# Patient Record
Sex: Female | Born: 1985 | Race: White | Hispanic: No | Smoking: Never smoker
Health system: Southern US, Community
[De-identification: ages and names within clinical notes are randomized; demographics above are authoritative.]

## PROBLEM LIST (undated history)

## (undated) DIAGNOSIS — J189 Pneumonia, unspecified organism: Secondary | ICD-10-CM

## (undated) DIAGNOSIS — K219 Gastro-esophageal reflux disease without esophagitis: Secondary | ICD-10-CM

## (undated) DIAGNOSIS — F909 Attention-deficit hyperactivity disorder, unspecified type: Secondary | ICD-10-CM

## (undated) HISTORY — PX: BREAST ENHANCEMENT SURGERY: SHX7

---

## 2009-06-05 HISTORY — PX: BREAST ENHANCEMENT SURGERY: SHX7

## 2009-06-28 ENCOUNTER — Emergency Department (HOSPITAL_COMMUNITY): Admission: EM | Admit: 2009-06-28 | Discharge: 2009-06-28 | Payer: Self-pay | Admitting: Emergency Medicine

## 2013-01-05 ENCOUNTER — Emergency Department (HOSPITAL_COMMUNITY)
Admission: EM | Admit: 2013-01-05 | Discharge: 2013-01-05 | Disposition: A | Discharge status: Home / Self Care | Attending: Emergency Medicine | Admitting: Emergency Medicine

## 2013-01-05 ENCOUNTER — Emergency Department (HOSPITAL_COMMUNITY)

## 2013-01-05 ENCOUNTER — Encounter (HOSPITAL_COMMUNITY): Payer: Self-pay | Admitting: *Deleted

## 2013-01-05 DIAGNOSIS — R079 Chest pain, unspecified: Secondary | ICD-10-CM

## 2013-01-05 DIAGNOSIS — Y939 Activity, unspecified: Secondary | ICD-10-CM | POA: Insufficient documentation

## 2013-01-05 DIAGNOSIS — Y929 Unspecified place or not applicable: Secondary | ICD-10-CM | POA: Insufficient documentation

## 2013-01-05 DIAGNOSIS — R0602 Shortness of breath: Secondary | ICD-10-CM | POA: Insufficient documentation

## 2013-01-05 DIAGNOSIS — S298XXA Other specified injuries of thorax, initial encounter: Secondary | ICD-10-CM | POA: Insufficient documentation

## 2013-01-05 DIAGNOSIS — W1809XA Striking against other object with subsequent fall, initial encounter: Secondary | ICD-10-CM | POA: Insufficient documentation

## 2013-01-05 DIAGNOSIS — W19XXXA Unspecified fall, initial encounter: Secondary | ICD-10-CM

## 2013-01-05 MED ORDER — HYDROCODONE-ACETAMINOPHEN 5-325 MG PO TABS
2.0000 | ORAL_TABLET | Freq: Once | ORAL | Status: AC
  Administered 2013-01-05: 2 via ORAL
  Filled 2013-01-05: qty 2

## 2013-01-05 MED ORDER — HYDROCODONE-ACETAMINOPHEN 5-325 MG PO TABS: 1.0000 | ORAL_TABLET | Freq: Four times a day (QID) | ORAL | Status: DC | PRN

## 2013-01-05 NOTE — ED Notes (Signed)
Pt states that she was hiking this am and fall and struck her chest on the ground; pt c/o of feeling short of breath; tenderness to palpate to midsternal area; pt states left is more tender than right; no obvious deformity.

## 2013-01-05 NOTE — ED Provider Notes (Signed)
CSN: [id number]     Arrival date & time 01/05/13  2155 History     First MD Initiated Contact with Patient 01/05/13 2218     Chief Complaint  Patient presents with  . Fall  . Chest Injury   (Consider location/radiation/quality/duration/timing/severity/associated sxs/prior Treatment) Patient is a 27 y.o. female presenting with fall. The history is provided by the patient.  Fall This is a new problem. The current episode started yesterday. Episode frequency: once. The problem has not changed since onset.Associated symptoms include chest pain. Pertinent negatives include no shortness of breath. Nothing aggravates the symptoms. Nothing relieves the symptoms. She has tried nothing for the symptoms.    History reviewed. No pertinent past medical history. Past Surgical History  Procedure Laterality Date  . Breast enhancement surgery     No family history on file. History  Substance Use Topics  . Smoking status: Never Smoker   . Smokeless tobacco: Not on file  . Alcohol Use: Yes     Comment: occ   OB History   Grav Para Term Preterm Abortions TAB SAB Ect Mult Living                 Review of Systems  Respiratory: Negative for cough and shortness of breath.   Cardiovascular: Positive for chest pain.  All other systems reviewed and are negative.    Allergies  Review of patient's allergies indicates no known allergies.  Home Medications  No current outpatient prescriptions on file. BP 105/73  Pulse 74  Temp(Src) 98.8 F (37.1 C) (Oral)  Resp 19  Ht 5\' 10"  (1.778 m)  Wt 140 lb (63.504 kg)  BMI 20.09 kg/m2  SpO2 100%  LMP 12/26/2012 Physical Exam  Nursing note and vitals reviewed. Constitutional: She is oriented to person, place, and time. She appears well-developed and well-nourished. No distress.  HENT:  Head: Normocephalic and atraumatic.  Eyes: EOM are normal. Pupils are equal, round, and reactive to light.  Neck: Normal range of motion. Neck supple.   Cardiovascular: Normal rate and regular rhythm.  Exam reveals no friction rub.   No murmur heard. Pulmonary/Chest: Effort normal and breath sounds normal. No respiratory distress. She has no wheezes. She has no rales. She exhibits tenderness (mild lower sternal tenderness, bilateral cartilaginous tenderness along medial costal margin bilaterally).  Abdominal: Soft. She exhibits no distension. There is no tenderness. There is no rebound.  Musculoskeletal: Normal range of motion. She exhibits no edema.  Neurological: She is alert and oriented to person, place, and time.  Skin: She is not diaphoretic.    ED Course   Procedures (including critical care time)  Labs Reviewed - No data to display Dg Chest 2 View  01/05/2013   *RADIOLOGY REPORT*  Clinical Data: Fall, chest injury  CHEST - 2 VIEW  Comparison:  None.  Findings:  The heart size and mediastinal contours are within normal limits.  Both lungs are clear.  The visualized skeletal structures are unremarkable. Apparent body piercings noted of the spinal soft tissues.  Breast implants evident.  IMPRESSION: No active cardiopulmonary disease.   Original Report Authenticated By: Judie Petit. Miles Costain, M.D.   1. Fall, initial encounter   2. Chest pain     Date: 01/05/2013  Rate: 71  Rhythm: normal sinus rhythm  QRS Axis: normal  Intervals: normal  ST/T Wave abnormalities: normal  Conduction Disutrbances:none  Narrative Interpretation:   Old EKG Reviewed: none available    MDM   7F fall yesterday, landed on chest.  Persistent sternal tenderness since. Mild SOB due to chest pain. AFVSS here. Mild lower sternal tenderness, more tender along costal margins medially biaterally. No bony crepitus. No pain with AP or lateral compression. CXR normal. Given pain meds. Discharged with small amount of pain medicine.   Dagmar Hait, MD 01/05/13 [phone]

## 2013-01-16 ENCOUNTER — Encounter (HOSPITAL_COMMUNITY): Payer: Self-pay | Admitting: Emergency Medicine

## 2013-01-16 ENCOUNTER — Emergency Department (HOSPITAL_COMMUNITY)
Admission: EM | Admit: 2013-01-16 | Discharge: 2013-01-16 | Discharge status: Against Medical Advice | Attending: Emergency Medicine | Admitting: Emergency Medicine

## 2013-01-16 DIAGNOSIS — Y9301 Activity, walking, marching and hiking: Secondary | ICD-10-CM | POA: Insufficient documentation

## 2013-01-16 DIAGNOSIS — R296 Repeated falls: Secondary | ICD-10-CM | POA: Insufficient documentation

## 2013-01-16 DIAGNOSIS — Y929 Unspecified place or not applicable: Secondary | ICD-10-CM | POA: Insufficient documentation

## 2013-01-16 DIAGNOSIS — S298XXA Other specified injuries of thorax, initial encounter: Secondary | ICD-10-CM | POA: Insufficient documentation

## 2013-01-16 DIAGNOSIS — R071 Chest pain on breathing: Secondary | ICD-10-CM | POA: Insufficient documentation

## 2013-01-16 NOTE — ED Notes (Signed)
Pt states she was seen here recently for fall while hiking, xray completed. Pt c/o continued pain with breathing. Pt did not follow up with PCP.

## 2013-01-23 ENCOUNTER — Encounter: Payer: Self-pay | Admitting: Certified Nurse Midwife

## 2013-06-27 ENCOUNTER — Ambulatory Visit (INDEPENDENT_AMBULATORY_CARE_PROVIDER_SITE_OTHER): Admitting: Internal Medicine

## 2013-06-27 DIAGNOSIS — Z23 Encounter for immunization: Secondary | ICD-10-CM

## 2013-06-27 MED ORDER — CIPROFLOXACIN HCL 500 MG PO TABS: 500.0000 mg | ORAL_TABLET | Freq: Two times a day (BID) | ORAL | Status: DC

## 2013-06-27 MED ORDER — DEXAMETHASONE 4 MG PO TABS: 4.0000 mg | ORAL_TABLET | Freq: Two times a day (BID) | ORAL | Status: DC

## 2013-06-27 MED ORDER — ACETAZOLAMIDE 125 MG PO TABS: 125.0000 mg | ORAL_TABLET | Freq: Three times a day (TID) | ORAL | Status: DC

## 2013-06-27 MED ORDER — ACETAZOLAMIDE 125 MG PO TABS: 125.0000 mg | ORAL_TABLET | Freq: Two times a day (BID) | ORAL | Status: DC

## 2013-06-27 NOTE — Progress Notes (Signed)
  Subjective:    Katherine Keith is a 28 y.o. female who presents to the Infectious Disease clinic for travel consultation. Planned departure date: February          Planned return date: 1 week Countries of travel: [place] Areas in [place]   Accommodations: camping Purpose of travel: sporting (climbing or diving) Prior travel out of [place]: yes Currently ill / Fever: no History of liver or kidney disease: no  Data Review:  no medical problems   Review of Systems n/a    Objective:    n/a    Assessment:    No contraindications to travel. none      Plan:    Issues discussed: altitude illness, future shots, insect-borne illnesses, malaria, rabies, safe food/water, traveler's diarrhea, website/handouts for more information, what to do if ill upon return, what to do if ill while there and Yellow Fever.Discussed altitude illness.  Counseled on Diamox and rescue dexamethasone.  Counseled on risks of rapid ascent that she is doing.  Immunizations recommended: Hepatitis A series and Typhoid (parenteral). Malaria prophylaxis: not indicatedwill not be outside of mountain area and [place] directly there from [place], no other travel planned Traveler's diarrhea prophylaxis: ciprofloxacin.

## 2014-05-05 ENCOUNTER — Ambulatory Visit (INDEPENDENT_AMBULATORY_CARE_PROVIDER_SITE_OTHER): Admitting: Emergency Medicine

## 2014-05-05 ENCOUNTER — Other Ambulatory Visit: Payer: Self-pay | Admitting: Emergency Medicine

## 2014-05-05 ENCOUNTER — Ambulatory Visit (INDEPENDENT_AMBULATORY_CARE_PROVIDER_SITE_OTHER)

## 2014-05-05 ENCOUNTER — Ambulatory Visit
Admission: RE | Admit: 2014-05-05 | Discharge: 2014-05-05 | Disposition: A | Discharge status: Home / Self Care | Source: Ambulatory Visit | Attending: Emergency Medicine | Admitting: Emergency Medicine

## 2014-05-05 ENCOUNTER — Telehealth: Payer: Self-pay

## 2014-05-05 VITALS — BP 100/58 | HR 84 | Temp 98.9°F | Resp 18

## 2014-05-05 DIAGNOSIS — F458 Other somatoform disorders: Secondary | ICD-10-CM

## 2014-05-05 DIAGNOSIS — R0602 Shortness of breath: Secondary | ICD-10-CM

## 2014-05-05 LAB — COMPREHENSIVE METABOLIC PANEL
ALK PHOS: 56 U/L (ref 39–117)
ALT: 11 U/L (ref 0–35)
AST: 22 U/L (ref 0–37)
Albumin: 4.6 g/dL (ref 3.5–5.2)
BILIRUBIN TOTAL: 0.6 mg/dL (ref 0.2–1.2)
BUN: 18 mg/dL (ref 6–23)
CHLORIDE: 94 meq/L — AB (ref 96–112)
CO2: 31 meq/L (ref 19–32)
Calcium: 10.4 mg/dL (ref 8.4–10.5)
Creat: 1.3 mg/dL — ABNORMAL HIGH (ref 0.50–1.10)
Glucose, Bld: 90 mg/dL (ref 70–99)
POTASSIUM: 3.6 meq/L (ref 3.5–5.3)
Sodium: 134 mEq/L — ABNORMAL LOW (ref 135–145)
TOTAL PROTEIN: 7.7 g/dL (ref 6.0–8.3)

## 2014-05-05 LAB — POCT CBC
Granulocyte percent: 71.9 %G (ref 37–80)
HEMATOCRIT: 42 % (ref 37.7–47.9)
HEMOGLOBIN: 13.7 g/dL (ref 12.2–16.2)
Lymph, poc: 1.7 (ref 0.6–3.4)
MCH: 27.6 pg (ref 27–31.2)
MCHC: 32.6 g/dL (ref 31.8–35.4)
MCV: 84.8 fL (ref 80–97)
MID (cbc): 0.2 (ref 0–0.9)
MPV: 7.4 fL (ref 0–99.8)
POC Granulocyte: 4.7 (ref 2–6.9)
POC LYMPH PERCENT: 25.1 %L (ref 10–50)
POC MID %: 3 %M (ref 0–12)
Platelet Count, POC: 323 10*3/uL (ref 142–424)
RBC: 4.95 M/uL (ref 4.04–5.48)
RDW, POC: 15 %
WBC: 6.6 10*3/uL (ref 4.6–10.2)

## 2014-05-05 MED ORDER — IOHEXOL 350 MG/ML SOLN
100.0000 mL | Freq: Once | INTRAVENOUS | Status: AC | PRN
  Administered 2014-05-05: 100 mL via INTRAVENOUS

## 2014-05-05 NOTE — Progress Notes (Signed)
[facility] [address], [place] [id number] [phone]  Date:  05/05/2014   Name:  Katherine BarbaraKristen Nicklin   DOB:  05/12/1986   MRN:  [id number]  PCP:  No primary care provider on file.    Chief Complaint: Shortness of Breath and Cough   History of Present Illness:  Katherine Keith is a 28 y.o. very pleasant female patient who presents with the following:  Patient has three week history of shortness of breath. Partner says she makes a lot of noise "like death rattles" at night. She has no history of asthma or reactive airway disease.  No cough or sputum production No hemoptysis. No chest pain No nausea or vomiting. No coryza. Denies unusual stress or anxiety.  Works as bar tender  Non smoker.   IUD Says feels as though she experiences a "wall" that limits her lung expansion. Can't get a satisfying breath Has tingling around mouth and in fingers.  No carpopedal spasm. No improvement with over the counter medications or other home remedies.  Denies other complaint or health concern today.   There are no active problems to display for this patient.   No past medical history on file.  Past Surgical History  Procedure Laterality Date  . Breast enhancement surgery      History  Substance Use Topics  . Smoking status: Never Smoker   . Smokeless tobacco: Not on file  . Alcohol Use: Yes     Comment: occ    No family history on file.  No Known Allergies  Medication list has been reviewed and updated.  Current Outpatient Prescriptions on File Prior to Visit  Medication Sig Dispense Refill  . acetaZOLAMIDE (DIAMOX) 125 MG tablet Take 1 tablet (125 mg total) by mouth 2 (two) times daily. 14 tablet 0  . ciprofloxacin (CIPRO) 500 MG tablet Take 1 tablet (500 mg total) by mouth 2 (two) times daily. Take 2-3 days until diarrhea resolves 8 tablet 0  . dexamethasone (DECADRON) 4 MG tablet Take 1 tablet (4 mg total) by mouth 2 (two) times daily with a meal.  8 tablet 0  . HYDROcodone-acetaminophen (NORCO/VICODIN) 5-325 MG per tablet Take 1 tablet by mouth every 6 (six) hours as needed for pain. 20 tablet 0   No current facility-administered medications on file prior to visit.    Review of Systems:  As per HPI, otherwise negative.    Physical Examination: Filed Vitals:   05/05/14 1130  BP: 100/58  Pulse: 84  Temp: 98.9 F (37.2 C)  Resp: 18   There were no vitals filed for this visit. There is no weight on file to calculate BMI. Ideal Body Weight:    GEN: WDWN, NAD, Non-toxic, A & O x 3 HEENT: Atraumatic, Normocephalic. Neck supple. No masses, No LAD. Ears and Nose: No external deformity. CV: RRR, No M/G/R. No JVD. No thrill. No extra heart sounds. PULM: CTA B, no wheezes, crackles, rhonchi. No retractions. No resp. distress. No accessory muscle use. ABD: S, NT, ND, +BS. No rebound. No HSM. EXTR: No c/c/e NEURO Normal gait.  PSYCH: Normally interactive. Conversant. Not depressed or anxious appearing.  Calm demeanor.    Assessment and Plan: Shortness of breath Hyperventilation syndrome Pulmonary Ativan  Signed,  Phillips OdorJeffery Yannely Kintzel, MD   [facility] reading (PRIMARY) by  Dr. Dareen PianoAnderson.  Negative chest  Results for orders placed or performed in visit on 05/05/14  POCT CBC  Result Value Ref Range   WBC 6.6 4.6 -  10.2 K/uL   Lymph, poc 1.7 0.6 - 3.4   POC LYMPH PERCENT 25.1 10 - 50 %L   MID (cbc) 0.2 0 - 0.9   POC MID % 3.0 0 - 12 %M   POC Granulocyte 4.7 2 - 6.9   Granulocyte percent 71.9 37 - 80 %G   RBC 4.95 4.04 - 5.48 M/uL   Hemoglobin 13.7 12.2 - 16.2 g/dL   HCT, POC 16.142.0 09.637.7 - 47.9 %   MCV 84.8 80 - 97 fL   MCH, POC 27.6 27 - 31.2 pg   MCHC 32.6 31.8 - 35.4 g/dL   RDW, POC 04.515.0 %   Platelet Count, POC 323 142 - 424 K/uL   MPV 7.4 0 - 99.8 fL   .

## 2014-05-05 NOTE — Telephone Encounter (Signed)
Please call pt and let her know that her CT was neg and that we will refer her to pulm.  Katherine Keith called earlier and LM. Just tried calling again and left another message.

## 2014-05-06 NOTE — Telephone Encounter (Signed)
LM on dedicated VM advising pt we are referring her to a pulmonologist. Rtn call if any further questions.

## 2014-05-19 ENCOUNTER — Encounter: Payer: Self-pay | Admitting: Internal Medicine

## 2014-05-19 ENCOUNTER — Ambulatory Visit (INDEPENDENT_AMBULATORY_CARE_PROVIDER_SITE_OTHER): Admitting: Internal Medicine

## 2014-05-19 VITALS — BP 90/60 | HR 80 | Ht 69.0 in | Wt 147.0 lb

## 2014-05-19 DIAGNOSIS — R0600 Dyspnea, unspecified: Secondary | ICD-10-CM

## 2014-05-19 DIAGNOSIS — R0789 Other chest pain: Secondary | ICD-10-CM

## 2014-05-19 MED ORDER — FAMOTIDINE 20 MG PO TABS: ORAL_TABLET | ORAL | Status: DC

## 2014-05-19 MED ORDER — PANTOPRAZOLE SODIUM 40 MG PO TBEC: 40.0000 mg | DELAYED_RELEASE_TABLET | Freq: Every day | ORAL | Status: DC

## 2014-05-19 NOTE — Patient Instructions (Signed)
Pantoprazole (protonix) 40 mg   Take 30-60 min before first meal of the day and Pepcid 20 mg one bedtime until return to office - this is the best way to tell whether stomach acid is contributing to your problem.    GERD (REFLUX)  is an extremely common cause of respiratory symptoms just like yours , many times with no obvious heartburn at all.    It can be treated with medication, but also with lifestyle changes including avoidance of late meals, excessive alcohol, smoking cessation, and avoid fatty foods, chocolate, peppermint, colas, red wine, and acidic juices such as orange juice.  NO MINT OR MENTHOL PRODUCTS SO NO COUGH DROPS  USE SUGARLESS CANDY INSTEAD (Jolley ranchers or Stover's or Life Savers) or even ice chips will also do - the key is to swallow to prevent all throat clearing. NO OIL BASED VITAMINS - use powdered substitutes.   Classic subdiaphragmatic pain pattern suggests ibs:  Stereotypical, migratory with a very limited distribution of pain locations, daytime, not exacerbated by ex or coughing, worse in sitting position, associated with generalized abd bloating, not present supine due to the dome effect of the diaphragm is  canceled in that position. Frequently these patients have had multiple negative GI workups and CT scans.  Treatment consists of avoiding foods that cause gas (especially beans and raw vegetables like spinach and salads and boiled)  and citrucel 1 heaping tsp twice daily with a large glass of water.  Pain should improve w/in 2 weeks and if not then consider further GI work up.     Please schedule a follow up office visit in 4 weeks, sooner if needed

## 2014-05-19 NOTE — Progress Notes (Signed)
Subjective:    Patient ID: Katherine BarbaraKristen Effertz, female    DOB: 1985/09/18, MRN: [id number]  HPI  28 yowf [place] smoker from [place] somewhat overweight growing up and not aerobic but started aerobics spring 2015 after losing about 80 and making progress with wts and then aerobics  x 30 min then abruptly 05/05/14 while in early stages of routine ex develped sense of sob> stopped ex then drove to [facility] and develped  fleeting L cp while standing waiting to see the doc > CTa neg so Referred by Dr Marice PotterJeff anderson  to pulmonary clinic 05/19/14     05/19/2014 1st [facility] office visit/ [facility]   Chief Complaint  Patient presents with  . Pulmonary Consult    Referred by Dr. Thornton PapasJeffrey Anderson. Pt c/o tightness and SOB for the past 2 wks. She states that her chest and back constantly feel tight and she feels as if she is unable to take in a good deep breath.  She occ has sharp pain under left breast.   whenever awake feels can't get deep breath if thinks about it but doesn't disturb sleep nor does she have any cp supine but continues to have fleeting pains in LUQ sitting and standing and has not tried to resume ex  Has new IUD x 2 m  No obvious other patterns in day to day or daytime variabilty or assoc chronic cough or c  chest tightness, subjective wheeze overt sinus or hb symptoms. No unusual exp hx or h/o childhood pna/ asthma or knowledge of premature birth.  Sleeping ok without nocturnal  or early am exacerbation  of respiratory  c/o's or need for noct saba. Also denies any obvious fluctuation of symptoms with weather or environmental changes or other aggravating or alleviating factors except as outlined above   Current Medications, Allergies, Complete Past Medical History, Past Surgical History, Family History, and Social History were reviewed in [facility] electronic medical record.            Review of Systems  Constitutional: Negative for fever, chills and unexpected weight  change.  HENT: Negative for congestion, dental problem, ear pain, nosebleeds, postnasal drip, rhinorrhea, sinus pressure, sneezing, sore throat, trouble swallowing and voice change.   Eyes: Negative for visual disturbance.  Respiratory: Positive for shortness of breath. Negative for cough and choking.   Cardiovascular: Positive for chest pain. Negative for leg swelling.  Gastrointestinal: Negative for vomiting, abdominal pain and diarrhea.  Genitourinary: Negative for difficulty urinating.  Musculoskeletal: Negative for arthralgias.  Skin: Negative for rash.  Neurological: Negative for tremors, syncope and headaches.  Hematological: Does not bruise/bleed easily.       Objective:   Physical Exam  Pleasant amb wf nad  Wt Readings from Last 3 Encounters:  05/19/14 147 lb (66.679 kg)  01/05/13 140 lb (63.504 kg)    Vital signs reviewed   HEENT: nl dentition, turbinates, and orophanx. Nl external ear canals without cough reflex   NECK :  without JVD/Nodes/TM/ nl carotid upstrokes bilaterally   LUNGS: no acc muscle use, clear to A and P bilaterally without cough on insp or exp maneuvers   CV:  RRR  no s3 or murmur or increase in P2, no edema   ABD:  soft and nontender with nl excursion in the supine position. No bruits or organomegaly, bowel sounds nl  MS:  warm without deformities, calf tenderness, cyanosis or clubbing  SKIN: warm and dry without lesions    NEURO:  alert,  approp, no deficits   05/05/14  Negative for pulmonary embolus. Negative chest CT.           Assessment & Plan:

## 2014-05-20 DIAGNOSIS — R0789 Other chest pain: Secondary | ICD-10-CM | POA: Insufficient documentation

## 2014-05-20 NOTE — Assessment & Plan Note (Signed)

## 2014-05-20 NOTE — Assessment & Plan Note (Signed)
Spirometry 05/19/14 only abn in effort dep portion   Symptoms are markedly disproportionate to objective findings and not clear this is a lung problem but pt does appear to have difficult airway management issues. DDX of  difficult airways management all start with A and  include Adherence, Ace Inhibitors, Acid Reflux, Active Sinus Disease, Alpha 1 Antitripsin deficiency, Anxiety masquerading as Airways dz,  ABPA,  allergy(esp in young), Aspiration (esp in elderly), Adverse effects of DPI,  Active smokers, plus two Bs  = Bronchiectasis and Beta blocker use..and one C= CHF  Adherence is always the initial "prime suspect" and is a multilayered concern that requires a "trust but verify" approach in every patient - starting with knowing how to use medications, especially inhalers, correctly, keeping up with refills and understanding the fundamental difference between maintenance and prns vs those medications only taken for a very short course and then stopped and not refilled.   ? Acid (or non-acid) GERD > always difficult to exclude as up to 75% of pts in some series report no assoc GI/ Heartburn symptoms> rec max (24h)  acid suppression and diet restrictions/ reviewed and instructions given in writing.   ? Anxiety > strongly supported by pfts and absence of symptoms sleeping, needs to resume exercise

## 2014-06-16 ENCOUNTER — Ambulatory Visit: Admitting: Internal Medicine

## 2014-06-22 ENCOUNTER — Ambulatory Visit (INDEPENDENT_AMBULATORY_CARE_PROVIDER_SITE_OTHER): Admitting: Internal Medicine

## 2014-06-22 ENCOUNTER — Encounter: Payer: Self-pay | Admitting: Internal Medicine

## 2014-06-22 VITALS — BP 90/60 | HR 70 | Ht 69.0 in | Wt 147.6 lb

## 2014-06-22 DIAGNOSIS — R0600 Dyspnea, unspecified: Secondary | ICD-10-CM

## 2014-06-22 DIAGNOSIS — R0789 Other chest pain: Secondary | ICD-10-CM

## 2014-06-22 NOTE — Assessment & Plan Note (Signed)
rx as IBS 05/20/2014 > 90% improvement 06/22/2014 > reinforced diet/ citrucel > f/u gi prn

## 2014-06-22 NOTE — Progress Notes (Signed)
Subjective:    Patient ID: Katherine Keith, female    DOB: 01-20-1986, MRN: [id number]    Brief patient profile:  28 yowf [place] smoker from [place] somewhat overweight growing up and not aerobic but started aerobics spring 2015 after losing about 80 and making progress with wts and then aerobics  x 30 min then abruptly 05/05/14 while in early stages of routine ex develped sense of sob> stopped ex then drove to [facility] and develped  fleeting L cp while standing waiting to see the doc > CTa neg so Referred by Dr Marice PotterJeff anderson  to pulmonary clinic 05/19/14 and rx as gerd/ ibs.     History of Present Illness  05/19/2014 1st [facility] office visit/ Dvante Hands   Chief Complaint  Patient presents with  . Pulmonary Consult    Referred by Dr. Thornton PapasJeffrey Anderson. Pt c/o tightness and SOB for the past 2 wks. She states that her chest and back constantly feel tight and she feels as if she is unable to take in a good deep breath.  She occ has sharp pain under left breast.   whenever awake feels can't get deep breath if thinks about it but doesn't disturb sleep nor does she have any cp supine but continues to have fleeting pains in LUQ sitting and standing and has not tried to resume ex Has new IUD x 2 m rec Pantoprazole (protonix) 40 mg   Take 30-60 min before first meal of the day and Pepcid 20 mg one bedtime until return to office   GERD diet  IBS diet/ citrucel    06/22/2014 f/u ov/Nickole Adamek re: cp/ sob  Chief Complaint  Patient presents with  . Follow-up    Pt statea that her chest tightness and SOB are much improved, but not completely resolved. She denies any new co's today.   still pain under L breast, always sitting / never lying down / happens only 2-3 times a day now  Pain now only lasts a sec or two and gone, no pattern except always sitting / no relation to eating or flatus/ eructation.  Sob is 90%  is gone  No obvious day to day or daytime variabilty or assoc  cough chest tightness,  subjective wheeze overt sinus or hb symptoms. No unusual exp hx or h/o childhood pna/ asthma or knowledge of premature birth.  Sleeping ok without nocturnal  or early am exacerbation  of respiratory  c/o's or need for noct saba. Also denies any obvious fluctuation of symptoms with weather or environmental changes or other aggravating or alleviating factors except as outlined above   Current Medications, Allergies, Complete Past Medical History, Past Surgical History, Family History, and Social History were reviewed in [facility] electronic medical record.  ROS  The following are not active complaints unless bolded sore throat, dysphagia, dental problems, itching, sneezing,  nasal congestion or excess/ purulent secretions, ear ache,   fever, chills, sweats, unintended wt loss, pleuritic or exertional cp, hemoptysis,  orthopnea pnd or leg swelling, presyncope, palpitations, heartburn, abdominal pain, anorexia, nausea, vomiting, diarrhea  or change in bowel or urinary habits, change in stools or urine, dysuria,hematuria,  rash, arthralgias, visual complaints, headache, numbness weakness or ataxia or problems with walking or coordination,  change in mood/affect or memory.                        Objective:   Physical Exam  Pleasant amb wf nad  06/22/2014  148  Wt Readings from Last 3 Encounters:  05/19/14 147 lb (66.679 kg)  01/05/13 140 lb (63.504 kg)    Vital signs reviewed   HEENT: nl dentition, turbinates, and orophanx. Nl external ear canals without cough reflex   NECK :  without JVD/Nodes/TM/ nl carotid upstrokes bilaterally   LUNGS: no acc muscle use, clear to A and P bilaterally without cough on insp or exp maneuvers   CV:  RRR  no s3 or murmur or increase in P2, no edema   ABD:  soft and nontender with nl excursion in the supine position. No bruits or organomegaly, bowel sounds nl  MS:  warm without deformities, calf tenderness, cyanosis or clubbing  SKIN:  warm and dry without lesions        05/05/14  Negative for pulmonary embolus. Negative chest CT.           Assessment & Plan:

## 2014-06-22 NOTE — Progress Notes (Deleted)
   Subjective:    Patient ID: Katherine Keith, female    DOB: 26-Nov-1985, MRN: [id number]  HPI  28 yowf [place] overweight growing up and not aerobic but started aerobics spring 2015 after losing about 80 and making progress with wts and then aerobics  x 30 min then abruptly 05/05/14 while in early stages of routine ex develped sense of sob> stopped ex then drove to [facility] and develped  fleeting L cp while standing waiting to see the doc > CTa neg so Referred by Dr Marice PotterJeff anderson  to pulmonary clinic 05/19/14     05/19/2014 1st [facility] office visit/ Khyre Germond   Chief Complaint  Patient presents with  . Pulmonary Consult    Referred by Dr. Thornton PapasJeffrey Anderson. Pt c/o tightness and SOB for the past 2 wks. She states that her chest and back constantly feel tight and she feels as if she is unable to take in a good deep breath.  She occ has sharp pain under left breast.   whenever awake feels can't get deep breath if thinks about it but doesn't disturb sleep nor does she have any cp supine but continues to have fleeting pains in LUQ sitting and standing and has not tried to resume ex Has new IUD x 2 m rec Pantoprazole (protonix) 40 mg   Take 30-60 min before first meal of the day and Pepcid 20 mg one bedtime until return to office   GERD diet  IBS diet/ citrucel    06/22/2014 f/u ov/Tyshawna Alarid re:  No chief complaint on file.                     Objective:   Physical Exam  Pleasant amb wf nad  06/22/2014    Wt Readings from Last 3 Encounters:  05/19/14 147 lb (66.679 kg)  01/05/13 140 lb (63.504 kg)    Vital signs reviewed   HEENT: nl dentition, turbinates, and orophanx. Nl external ear canals without cough reflex   NECK :  without JVD/Nodes/TM/ nl carotid upstrokes bilaterally   LUNGS: no acc muscle use, clear to A and P bilaterally without cough on insp or exp maneuvers   CV:  RRR  no s3 or murmur or increase in P2, no edema   ABD:  soft and  nontender with nl excursion in the supine position. No bruits or organomegaly, bowel sounds nl  MS:  warm without deformities, calf tenderness, cyanosis or clubbing  SKIN: warm and dry without lesions    NEURO:  alert, approp, no deficits   05/05/14  Negative for pulmonary embolus. Negative chest CT.           Assessment & Plan:

## 2014-06-22 NOTE — Assessment & Plan Note (Signed)
Spirometry 05/19/14 only abn in effort dep portion   Resolved on rx for gerd, try to simplify to bid h2

## 2014-06-22 NOTE — Progress Notes (Signed)
   Subjective:    Patient ID: Katherine Keith, female    DOB: Dec 18, 1985, MRN: [id number]  HPI  28 yowf [place] overweight [place] and not aerobic but started aerobics spring 2015 after losing about 80 and making progress with wts and then aerobics  x 30 min then abruptly 05/05/14 while in early stages of routine ex develped sense of sob> stopped ex then drove to [facility] and develped  fleeting L cp while standing waiting to see the doc > CTa neg so Referred by Dr Marice PotterJeff anderson  to pulmonary clinic 05/19/14     05/19/2014 1st [facility] office visit/ Kamyia Thomason   Chief Complaint  Patient presents with  . Pulmonary Consult    Referred by Dr. Thornton PapasJeffrey Anderson. Pt c/o tightness and SOB for the past 2 wks. She states that her chest and back constantly feel tight and she feels as if she is unable to take in a good deep breath.  She occ has sharp pain under left breast.   whenever awake feels can't get deep breath if thinks about it but doesn't disturb sleep nor does she have any cp supine but continues to have fleeting pains in LUQ sitting and standing and has not tried to resume ex Has new IUD x 2 m rec Pantoprazole (protonix) 40 mg   Take 30-60 min before first meal of the day and Pepcid 20 mg one bedtime until return to office   GERD diet  IBS diet/ citrucel    06/22/2014 f/u ov/[facility] re:  Chief Complaint  Patient presents with  . Follow-up    Pt statea that her chest tightness and SOB are much improved, but not completely resolved. She denies any new co's today.                       Objective:   Physical Exam  Pleasant amb wf nad  06/22/2014    Wt Readings from Last 3 Encounters:  05/19/14 147 lb (66.679 kg)  01/05/13 140 lb (63.504 kg)    Vital signs reviewed   HEENT: nl dentition, turbinates, and orophanx. Nl external ear canals without cough reflex   NECK :  without JVD/Nodes/TM/ nl carotid upstrokes bilaterally   LUNGS: no acc muscle  use, clear to A and P bilaterally without cough on insp or exp maneuvers   CV:  RRR  no s3 or murmur or increase in P2, no edema   ABD:  soft and nontender with nl excursion in the supine position. No bruits or organomegaly, bowel sounds nl  MS:  warm without deformities, calf tenderness, cyanosis or clubbing  SKIN: warm and dry without lesions    NEURO:  alert, approp, no deficits   05/05/14  Negative for pulmonary embolus. Negative chest CT.           Assessment & Plan:

## 2014-11-04 ENCOUNTER — Emergency Department (INDEPENDENT_AMBULATORY_CARE_PROVIDER_SITE_OTHER)
Admission: EM | Admit: 2014-11-04 | Discharge: 2014-11-04 | Disposition: A | Discharge status: Home / Self Care | Source: Home / Self Care | Attending: Family Medicine | Admitting: Family Medicine

## 2014-11-04 ENCOUNTER — Encounter (HOSPITAL_COMMUNITY): Payer: Self-pay | Admitting: Emergency Medicine

## 2014-11-04 DIAGNOSIS — L299 Pruritus, unspecified: Secondary | ICD-10-CM | POA: Diagnosis not present

## 2014-11-04 DIAGNOSIS — L237 Allergic contact dermatitis due to plants, except food: Secondary | ICD-10-CM | POA: Diagnosis not present

## 2014-11-04 MED ORDER — PREDNISONE 5 MG PO TABS: ORAL_TABLET | ORAL | Status: DC

## 2014-11-04 MED ORDER — HYDROXYZINE HCL 25 MG PO TABS: 25.0000 mg | ORAL_TABLET | Freq: Four times a day (QID) | ORAL | Status: DC | PRN

## 2014-11-04 MED ORDER — METHYLPREDNISOLONE ACETATE 80 MG/ML IJ SUSP
INTRAMUSCULAR | Status: AC
  Filled 2014-11-04: qty 1

## 2014-11-04 MED ORDER — METHYLPREDNISOLONE ACETATE 80 MG/ML IJ SUSP
80.0000 mg | Freq: Once | INTRAMUSCULAR | Status: AC
  Administered 2014-11-04: 80 mg via INTRAMUSCULAR

## 2014-11-04 NOTE — ED Notes (Signed)
C/o poison ivy for 10 days  States her legs are swelling with blisters on them  Used home remedies and otc creams as tx

## 2014-11-04 NOTE — Discharge Instructions (Signed)
[facility] Poison oak is a rash caused by touching the leaves of the poison oak plant. You may have a rash with redness and itching. Sometimes, blisters appear and break open. Your eyes may get puffy (swollen). Poison oak often heals in 2 to 3 weeks without treatment.  HOME CARE  If you touch poison oak:  Wash your skin with soap and water right away. Wash under your fingernails. Do not rub the skin very hard.  Wash any clothes you were wearing.  Avoid poison oak in the future. Poison oak usually has 3 leaves on a stem.  Use medicines to help with itching as told by your doctor. Do not drive when you take this medicine.  Keep open sores dry, clean, and covered with a bandage and medicated cream, if needed.  Ask your doctor about medicine for children. GET HELP RIGHT AWAY IF:  You have open sores.  Redness spreads beyond the area of the rash.  There is yellowish white fluid (pus) coming from the rash.  Pain gets worse.  You have a temperature by mouth above 102 F (38.9 C), not controlled by medicine. MAKE SURE YOU:  Understand these instructions.  Will watch your condition.  Will get help right away if you are not doing well or get worse. Document Released: 06/24/2010 Document Revised: 08/14/2011 Document Reviewed: 06/24/2010 [facility] Patient Information 2015 [employer] [place]. This information is not intended to replace advice given to you by your health care provider. Make sure you discuss any questions you have with your health care provider.    The steroid shot will help you within 24-36 hours. Start the oral prednisone tomorrow. Atarax for itching; will make you sleepy. Cortisone cream/lotion for acute itching and inflammation. Avoid hot showers or lotions.

## 2014-11-04 NOTE — ED Provider Notes (Signed)
CSN: [id number]     Arrival date & time 11/04/14  1826 History   First MD Initiated Contact with Patient 11/04/14 1916     Chief Complaint  Patient presents with  . Poison Ivy   (Consider location/radiation/quality/duration/timing/severity/associated sxs/prior Treatment) HPI Comments: Patient presents with "severe" itching and worsening rash to bilateral lower legs. She was exposed to poison ivy 10 days ago while doing yard work. It generally does not bother her, but the days following she began to have a rash. The rash has worsened and now causing local swelling and worsening redness. She is having trouble sleeping due to the itching. She is using an OTC soothing spray without much relief.   Patient is a 29 y.o. female presenting with poison ivy. The history is provided by the patient.  Poison Ivy    History reviewed. No pertinent past medical history. Past Surgical History  Procedure Laterality Date  . Breast enhancement surgery     History reviewed. No pertinent family history. History  Substance Use Topics  . Smoking status: Never Smoker   . Smokeless tobacco: Never Used  . Alcohol Use: 4.2 oz/week    7 Standard drinks or equivalent per week   OB History    No data available     Review of Systems  Constitutional: Negative for fever.  All other systems reviewed and are negative.   Allergies  Review of patient's allergies indicates no known allergies.  Home Medications   Prior to Admission medications   Medication Sig Start Date End Date Taking? Authorizing Provider  famotidine (PEPCID) 20 MG tablet One at bedtime 05/19/14   Nyoka CowdenMichael B Wert, MD  hydrOXYzine (ATARAX/VISTARIL) 25 MG tablet Take 1 tablet (25 mg total) by mouth every 6 (six) hours as needed for itching. 11/04/14   Riki SheerMichelle G Jeancarlos Marchena, PA-C  pantoprazole (PROTONIX) 40 MG tablet Take 1 tablet (40 mg total) by mouth daily. Take 30-60 min before first meal of the day 05/19/14   Nyoka CowdenMichael B Wert, MD  predniSONE  (DELTASONE) 5 MG tablet Take 4 tablets po x 3 days, then 3 tablets x 3 days, then 2 tablets x 3 days, then 1 tablets x 3 days then stop 11/04/14   Riki SheerMichelle G Barre Aydelott, PA-C   BP 119/74 mmHg  Pulse 83  Temp(Src) 98.3 F (36.8 C) (Oral)  Resp 16  SpO2 100%  LMP 10/23/2014 Physical Exam  Constitutional: She is oriented to person, place, and time. She appears well-developed and well-nourished. No distress.  HENT:  Head: Normocephalic and atraumatic.  Neurological: She is alert and oriented to person, place, and time.  Skin: Skin is warm and dry. Rash noted. She is not diaphoretic. There is erythema.  Moderate macular papular rash to bilateral lower extremities; mild bulla with an erythematous base. Less extent to upper extremities and trunk. Mild areas to face and chin  Psychiatric: Her behavior is normal.  Nursing note and vitals reviewed.   ED Course  Procedures (including critical care time) Labs Review Labs Reviewed - No data to display  Imaging Review No results found.   MDM   1. Poison oak dermatitis   2. Itching with irritation    Severe in presentation. Treat with DepoMedrol injection 80mg  followed by 10 days of low dose prednisone in this setting. Symptomatic relief with atarax with itching. OTC cortisone cream also if needed. Avoidance of hot showers and lotions that are drying. F/U if worsens.     Riki SheerMichelle G Geovannie Vilar, PA-C 11/04/14 1941

## 2015-08-19 ENCOUNTER — Encounter (HOSPITAL_COMMUNITY): Payer: Self-pay

## 2015-08-19 ENCOUNTER — Emergency Department (HOSPITAL_COMMUNITY)

## 2015-08-19 ENCOUNTER — Emergency Department (HOSPITAL_COMMUNITY)
Admission: EM | Admit: 2015-08-19 | Discharge: 2015-08-19 | Disposition: A | Discharge status: Home / Self Care | Attending: Emergency Medicine | Admitting: Emergency Medicine

## 2015-08-19 DIAGNOSIS — S60512A Abrasion of left hand, initial encounter: Secondary | ICD-10-CM | POA: Diagnosis not present

## 2015-08-19 DIAGNOSIS — Z79899 Other long term (current) drug therapy: Secondary | ICD-10-CM | POA: Diagnosis not present

## 2015-08-19 DIAGNOSIS — Y9389 Activity, other specified: Secondary | ICD-10-CM | POA: Diagnosis not present

## 2015-08-19 DIAGNOSIS — Z23 Encounter for immunization: Secondary | ICD-10-CM | POA: Insufficient documentation

## 2015-08-19 DIAGNOSIS — S0990XA Unspecified injury of head, initial encounter: Secondary | ICD-10-CM | POA: Diagnosis present

## 2015-08-19 DIAGNOSIS — Z7952 Long term (current) use of systemic steroids: Secondary | ICD-10-CM | POA: Insufficient documentation

## 2015-08-19 DIAGNOSIS — Z793 Long term (current) use of hormonal contraceptives: Secondary | ICD-10-CM | POA: Insufficient documentation

## 2015-08-19 DIAGNOSIS — Y92410 Unspecified street and highway as the place of occurrence of the external cause: Secondary | ICD-10-CM | POA: Insufficient documentation

## 2015-08-19 DIAGNOSIS — Y999 Unspecified external cause status: Secondary | ICD-10-CM | POA: Diagnosis not present

## 2015-08-19 DIAGNOSIS — S060X0A Concussion without loss of consciousness, initial encounter: Secondary | ICD-10-CM | POA: Insufficient documentation

## 2015-08-19 MED ORDER — SODIUM CHLORIDE 0.9 % IV BOLUS (SEPSIS)
1000.0000 mL | Freq: Once | INTRAVENOUS | Status: AC
  Administered 2015-08-19: 1000 mL via INTRAVENOUS

## 2015-08-19 MED ORDER — NAPROXEN 250 MG PO TABS: 250.0000 mg | ORAL_TABLET | Freq: Two times a day (BID) | ORAL | Status: DC

## 2015-08-19 MED ORDER — DIPHENHYDRAMINE HCL 50 MG/ML IJ SOLN
25.0000 mg | Freq: Once | INTRAMUSCULAR | Status: AC
  Administered 2015-08-19: 25 mg via INTRAVENOUS
  Filled 2015-08-19: qty 1

## 2015-08-19 MED ORDER — METOCLOPRAMIDE HCL 5 MG/ML IJ SOLN
10.0000 mg | Freq: Once | INTRAMUSCULAR | Status: AC
  Administered 2015-08-19: 10 mg via INTRAVENOUS
  Filled 2015-08-19: qty 2

## 2015-08-19 MED ORDER — TETANUS-DIPHTH-ACELL PERTUSSIS 5-2.5-18.5 LF-MCG/0.5 IM SUSP
0.5000 mL | Freq: Once | INTRAMUSCULAR | Status: AC
  Administered 2015-08-19: 0.5 mL via INTRAMUSCULAR
  Filled 2015-08-19: qty 0.5

## 2015-08-19 NOTE — ED Notes (Signed)
Patient transported to CT 
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
## 2015-08-19 NOTE — ED Notes (Signed)
Pt stable, ambulatory, states understanding of discharge instructions 
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
## 2015-08-19 NOTE — Discharge Instructions (Signed)
Concussion, Adult A concussion, or closed-head injury, is a brain injury caused by a direct blow to the head or by a quick and sudden movement (jolt) of the head or neck. Concussions are usually not life-threatening. Even so, the effects of a concussion can be serious. If you have had a concussion before, you are more likely to experience concussion-like symptoms after a direct blow to the head.  CAUSES  Direct blow to the head, such as from running into another player during a soccer game, being hit in a fight, or hitting your head on a hard surface.  A jolt of the head or neck that causes the brain to move back and forth inside the skull, such as in a car crash. SIGNS AND SYMPTOMS The signs of a concussion can be hard to notice. Early on, they may be missed by you, family members, and health care providers. You may look fine but act or feel differently. Symptoms are usually temporary, but they may last for days, weeks, or even longer. Some symptoms may appear right away while others may not show up for hours or days. Every head injury is different. Symptoms include:  Mild to moderate headaches that will not go away.  A feeling of pressure inside your head.  Having more trouble than usual:  Learning or remembering things you have heard.  Answering questions.  Paying attention or concentrating.  Organizing daily tasks.  Making decisions and solving problems.  Slowness in thinking, acting or reacting, speaking, or reading.  Getting lost or being easily confused.  Feeling tired all the time or lacking energy (fatigued).  Feeling drowsy.  Sleep disturbances.  Sleeping more than usual.  Sleeping less than usual.  Trouble falling asleep.  Trouble sleeping (insomnia).  Loss of balance or feeling lightheaded or dizzy.  Nausea or vomiting.  Numbness or tingling.  Increased sensitivity to:  Sounds.  Lights.  Distractions.  Vision problems or eyes that tire  easily.  Diminished sense of taste or smell.  Ringing in the ears.  Mood changes such as feeling sad or anxious.  Becoming easily irritated or angry for little or no reason.  Lack of motivation.  Seeing or hearing things other people do not see or hear (hallucinations). DIAGNOSIS Your health care provider can usually diagnose a concussion based on a description of your injury and symptoms. He or she will ask whether you passed out (lost consciousness) and whether you are having trouble remembering events that happened right before and during your injury. Your evaluation might include:  A brain scan to look for signs of injury to the brain. Even if the test shows no injury, you may still have a concussion.  Blood tests to be sure other problems are not present. TREATMENT  Concussions are usually treated in an emergency department, in urgent care, or at a clinic. You may need to stay in the hospital overnight for further treatment.  Tell your health care provider if you are taking any medicines, including prescription medicines, over-the-counter medicines, and natural remedies. Some medicines, such as blood thinners (anticoagulants) and aspirin, may increase the chance of complications. Also tell your health care provider whether you have had alcohol or are taking illegal drugs. This information may affect treatment.  Your health care provider will send you home with important instructions to follow.  How fast you will recover from a concussion depends on many factors. These factors include how severe your concussion is, what part of your brain was injured,   your age, and how healthy you were before the concussion.  Most people with mild injuries recover fully. Recovery can take time. In general, recovery is slower in older persons. Also, persons who have had a concussion in the past or have other medical problems may find that it takes longer to recover from their current injury. HOME  CARE INSTRUCTIONS General Instructions  Carefully follow the directions your health care provider gave you.  Only take over-the-counter or prescription medicines for pain, discomfort, or fever as directed by your health care provider.  Take only those medicines that your health care provider has approved.  Do not drink alcohol until your health care provider says you are well enough to do so. Alcohol and certain other drugs may slow your recovery and can put you at risk of further injury.  If it is harder than usual to remember things, write them down.  If you are easily distracted, try to do one thing at a time. For example, do not try to watch TV while fixing dinner.  Talk with family members or close friends when making important decisions.  Keep all follow-up appointments. Repeated evaluation of your symptoms is recommended for your recovery.  Watch your symptoms and tell others to do the same. Complications sometimes occur after a concussion. Older adults with a brain injury may have a higher risk of serious complications, such as a blood clot on the brain.  Tell your teachers, school nurse, school counselor, coach, athletic trainer, or work manager about your injury, symptoms, and restrictions. Tell them about what you can or cannot do. They should watch for:  Increased problems with attention or concentration.  Increased difficulty remembering or learning new information.  Increased time needed to complete tasks or assignments.  Increased irritability or decreased ability to cope with stress.  Increased symptoms.  Rest. Rest helps the brain to heal. Make sure you:  Get plenty of sleep at night. Avoid staying up late at night.  Keep the same bedtime hours on weekends and weekdays.  Rest during the day. Take daytime naps or rest breaks when you feel tired.  Limit activities that require a lot of thought or concentration. These include:  Doing homework or job-related  work.  Watching TV.  Working on the computer.  Avoid any situation where there is potential for another head injury (football, hockey, soccer, basketball, martial arts, downhill snow sports and horseback riding). Your condition will get worse every time you experience a concussion. You should avoid these activities until you are evaluated by the appropriate follow-up health care providers. Returning To Your Regular Activities You will need to return to your normal activities slowly, not all at once. You must give your body and brain enough time for recovery.  Do not return to sports or other athletic activities until your health care provider tells you it is safe to do so.  Ask your health care provider when you can drive, ride a bicycle, or operate heavy machinery. Your ability to react may be slower after a brain injury. Never do these activities if you are dizzy.  Ask your health care provider about when you can return to work or school. Preventing Another Concussion It is very important to avoid another brain injury, especially before you have recovered. In rare cases, another injury can lead to permanent brain damage, brain swelling, or death. The risk of this is greatest during the first 7-10 days after a head injury. Avoid injuries by:  Wearing a   seat belt when riding in a car.  Drinking alcohol only in moderation.  Wearing a helmet when biking, skiing, skateboarding, skating, or doing similar activities.  Avoiding activities that could lead to a second concussion, such as contact or recreational sports, until your health care provider says it is okay.  Taking safety measures in your home.  Remove clutter and tripping hazards from floors and stairways.  Use grab bars in bathrooms and handrails by stairs.  Place non-slip mats on floors and in bathtubs.  Improve lighting in dim areas. SEEK MEDICAL CARE IF:  You have increased problems paying attention or  concentrating.  You have increased difficulty remembering or learning new information.  You need more time to complete tasks or assignments than before.  You have increased irritability or decreased ability to cope with stress.  You have more symptoms than before. Seek medical care if you have any of the following symptoms for more than 2 weeks after your injury:  Lasting (chronic) headaches.  Dizziness or balance problems.  Nausea.  Vision problems.  Increased sensitivity to noise or light.  Depression or mood swings.  Anxiety or irritability.  Memory problems.  Difficulty concentrating or paying attention.  Sleep problems.  Feeling tired all the time. SEEK IMMEDIATE MEDICAL CARE IF:  You have severe or worsening headaches. These may be a sign of a blood clot in the brain.  You have weakness (even if only in one hand, leg, or part of the face).  You have numbness.  You have decreased coordination.  You vomit repeatedly.  You have increased sleepiness.  One pupil is larger than the other.  You have convulsions.  You have slurred speech.  You have increased confusion. This may be a sign of a blood clot in the brain.  You have increased restlessness, agitation, or irritability.  You are unable to recognize people or places.  You have neck pain.  It is difficult to wake you up.  You have unusual behavior changes.  You lose consciousness. MAKE SURE YOU:  Understand these instructions.  Will watch your condition.  Will get help right away if you are not doing well or get worse.   This information is not intended to replace advice given to you by your health care provider. Make sure you discuss any questions you have with your health care provider.   Document Released: 08/12/2003 Document Revised: 06/12/2014 Document Reviewed: 12/12/2012 Elsevier Interactive Patient Education 2016 [employer]   Post-Concussion Syndrome Post-concussion  syndrome describes the symptoms that can occur after a head injury. These symptoms can last from weeks to months. CAUSES  It is not clear why some head injuries cause post-concussion syndrome. It can occur whether your head injury was mild or severe and whether you were wearing head protection or not.  SIGNS AND SYMPTOMS  Memory difficulties.  Dizziness.  Headaches.  Double vision or blurry vision.  Sensitivity to light.  Hearing difficulties.  Depression.  Tiredness.  Weakness.  Difficulty with concentration.  Difficulty sleeping or staying asleep.  Vomiting.  Poor balance or instability on your feet.  Slow reaction time.  Difficulty learning and remembering things you have heard. DIAGNOSIS  There is no test to determine whether you have post-concussion syndrome. Your health care provider may order an imaging scan of your brain, such as a CT scan, to check for other problems that may be causing your symptoms (such as a severe injury inside your skull). TREATMENT  Usually, these problems disappear over   time without medical care. Your health care provider may prescribe medicine to help ease your symptoms. It is important to follow up with a neurologist to evaluate your recovery and address any lingering symptoms or issues. HOME CARE INSTRUCTIONS   Take medicines only as directed by your health care provider. Do not take aspirin. Aspirin can slow blood clotting.  Sleep with your head slightly elevated to help with headaches.  Avoid any situation where there is potential for another head injury. This includes football, hockey, soccer, basketball, martial arts, downhill snow sports, and horseback riding. Your condition will get worse every time you experience a concussion. You should avoid these activities until you are evaluated by the appropriate follow-up health care providers.  Keep all follow-up visits as directed by your health care provider. This is important. SEEK  MEDICAL CARE IF:  You have increased problems paying attention or concentrating.  You have increased difficulty remembering or learning new information.  You need more time to complete tasks or assignments than before.  You have increased irritability or decreased ability to cope with stress.  You have more symptoms than before. Seek medical care if you have any of the following symptoms for more than two weeks after your injury:  Lasting (chronic) headaches.  Dizziness or balance problems.  Nausea.  Vision problems.  Increased sensitivity to noise or light.  Depression or mood swings.  Anxiety or irritability.  Memory problems.  Difficulty concentrating or paying attention.  Sleep problems.  Feeling tired all the time. SEEK IMMEDIATE MEDICAL CARE IF:  You have confusion or unusual drowsiness.  Others find it difficult to wake you up.  You have nausea or persistent, forceful vomiting.  You feel like you are moving when you are not (vertigo). Your eyes may move rapidly back and forth.  You have convulsions or faint.  You have severe, persistent headaches that are not relieved by medicine.  You cannot use your arms or legs normally.  One of your pupils is larger than the other.  You have clear or bloody discharge from your nose or ears.  Your problems are getting worse, not better. MAKE SURE YOU:  Understand these instructions.  Will watch your condition.  Will get help right away if you are not doing well or get worse.   This information is not intended to replace advice given to you by your health care provider. Make sure you discuss any questions you have with your health care provider.   Document Released: 11/11/2001 Document Revised: 06/12/2014 Document Reviewed: 08/27/2013 Elsevier Interactive Patient Education 2016 Elsevier Inc.  

## 2015-08-19 NOTE — ED Notes (Signed)
Visitors at bedside.

## 2016-03-02 ENCOUNTER — Other Ambulatory Visit: Payer: Self-pay | Admitting: Family Medicine

## 2016-03-02 DIAGNOSIS — M25561 Pain in right knee: Secondary | ICD-10-CM

## 2016-03-04 ENCOUNTER — Ambulatory Visit
Admission: RE | Admit: 2016-03-04 | Discharge: 2016-03-04 | Disposition: A | Discharge status: Home / Self Care | Source: Ambulatory Visit | Attending: Family Medicine | Admitting: Family Medicine

## 2016-03-04 DIAGNOSIS — M25561 Pain in right knee: Secondary | ICD-10-CM

## 2016-11-17 ENCOUNTER — Emergency Department (HOSPITAL_COMMUNITY)
Admission: EM | Admit: 2016-11-17 | Discharge: 2016-11-17 | Disposition: A | Discharge status: Home / Self Care | Attending: Emergency Medicine | Admitting: Emergency Medicine

## 2016-11-17 ENCOUNTER — Encounter (HOSPITAL_COMMUNITY): Payer: Self-pay | Admitting: Emergency Medicine

## 2016-11-17 DIAGNOSIS — Y999 Unspecified external cause status: Secondary | ICD-10-CM | POA: Insufficient documentation

## 2016-11-17 DIAGNOSIS — Z23 Encounter for immunization: Secondary | ICD-10-CM | POA: Insufficient documentation

## 2016-11-17 DIAGNOSIS — Y929 Unspecified place or not applicable: Secondary | ICD-10-CM | POA: Insufficient documentation

## 2016-11-17 DIAGNOSIS — W278XXA Contact with other nonpowered hand tool, initial encounter: Secondary | ICD-10-CM | POA: Diagnosis not present

## 2016-11-17 DIAGNOSIS — S51812A Laceration without foreign body of left forearm, initial encounter: Secondary | ICD-10-CM | POA: Diagnosis not present

## 2016-11-17 DIAGNOSIS — Z79899 Other long term (current) drug therapy: Secondary | ICD-10-CM | POA: Insufficient documentation

## 2016-11-17 DIAGNOSIS — Y93D9 Activity, other involving arts and handcrafts: Secondary | ICD-10-CM | POA: Diagnosis not present

## 2016-11-17 DIAGNOSIS — S41112A Laceration without foreign body of left upper arm, initial encounter: Secondary | ICD-10-CM

## 2016-11-17 DIAGNOSIS — S59812A Other specified injuries left forearm, initial encounter: Secondary | ICD-10-CM | POA: Diagnosis present

## 2016-11-17 MED ORDER — LIDOCAINE HCL (PF) 1 % IJ SOLN
INTRAMUSCULAR | Status: AC
  Filled 2016-11-17: qty 5

## 2016-11-17 MED ORDER — TETANUS-DIPHTH-ACELL PERTUSSIS 5-2.5-18.5 LF-MCG/0.5 IM SUSP
0.5000 mL | Freq: Once | INTRAMUSCULAR | Status: AC
  Administered 2016-11-17: 0.5 mL via INTRAMUSCULAR
  Filled 2016-11-17: qty 0.5

## 2016-11-17 MED ORDER — LIDOCAINE HCL (PF) 1 % IJ SOLN
2.0000 mL | Freq: Once | INTRAMUSCULAR | Status: AC
  Administered 2016-11-17: 2 mL

## 2016-11-17 NOTE — ED Triage Notes (Addendum)
Pt was working on a project and cut her arm on a piece of glass. Pt felt like it was "spurting" and called 911.  EMS and fire controlled the bleeding and then the pt drove herself here.  Pt has complaint of feeling "tightness with opening her hand" and can "feel my pulse up into my elbow".

## 2016-11-17 NOTE — ED Provider Notes (Signed)
[facility]-EMERGENCY DEPT Provider Note   CSN: [id number] Arrival date & time: 11/17/16  0425     History   Chief Complaint Chief Complaint  Patient presents with  . Extremity Laceration    HPI Katherine Keith is a 31 y.o. female.  This a [age] she-year-old female who was working on a project when she inadvertently sustained a laceration to the medial aspect of her left forearm approximately 10 cm above the wrist joint. She states that was "spurting" and she called EMS who dress the wound and patient was able to drive herself to the emergency department.      History reviewed. No pertinent past medical history.  Patient Active Problem List   Diagnosis Date Noted  . L CP typical of IBS 05/20/2014  . Dyspnea 05/19/2014    Past Surgical History:  Procedure Laterality Date  . BREAST ENHANCEMENT SURGERY      OB History    No data available       Home Medications    Prior to Admission medications   Medication Sig Start Date End Date Taking? Authorizing Provider  famotidine (PEPCID) 20 MG tablet One at bedtime 05/19/14   Nyoka CowdenWert, Michael B, MD  hydrOXYzine (ATARAX/VISTARIL) 25 MG tablet Take 1 tablet (25 mg total) by mouth every 6 (six) hours as needed for itching. 11/04/14   Riki SheerYoung, Michelle G, PA-C  levonorgestrel (MIRENA) 20 MCG/24HR IUD 1 each by Intrauterine route once.    [provider]  naproxen (NAPROSYN) 250 MG tablet Take 1 tablet (250 mg total) by mouth 2 (two) times daily with a meal. 08/19/15   Everlene Farrieransie, William, PA-C  pantoprazole (PROTONIX) 40 MG tablet Take 1 tablet (40 mg total) by mouth daily. Take 30-60 min before first meal of the day 05/19/14   Nyoka CowdenWert, Michael B, MD  predniSONE (DELTASONE) 5 MG tablet Take 4 tablets po x 3 days, then 3 tablets x 3 days, then 2 tablets x 3 days, then 1 tablets x 3 days then stop 11/04/14   Riki SheerYoung, Michelle G, PA-C    Family History No family history on file.  Social History Social History  Substance Use Topics  . Smoking  status: Never Smoker  . Smokeless tobacco: Never Used  . Alcohol use 4.2 oz/week    7 Standard drinks or equivalent per week     Allergies   Patient has no known allergies.   Review of Systems Review of Systems  Skin: Positive for wound.  All other systems reviewed and are negative.    Physical Exam Updated Vital Signs BP 120/84 (BP Location: Right Arm)   Pulse 71   Temp 97.8 F (36.6 C) (Oral)   Resp 18   SpO2 98%   Physical Exam  Constitutional: She appears well-developed and well-nourished.  HENT:  Head: Normocephalic.  Eyes: Pupils are equal, round, and reactive to light.  Neck: Normal range of motion.  Cardiovascular: Normal rate.   Pulmonary/Chest: Effort normal.  Musculoskeletal: Normal range of motion. She exhibits no tenderness or deformity.       Arms: Neurological: She is alert.  Skin: Skin is warm.  Psychiatric: She has a normal mood and affect.  Nursing note and vitals reviewed.    ED Treatments / Results  Labs (all labs ordered are listed, but only abnormal results are displayed) Labs Reviewed - No data to display  EKG  EKG Interpretation None       Radiology No results found.  Procedures .Marland Kitchen.Laceration Repair Date/Time: 11/17/2016 5:18  AM Performed by: Earley Favor Authorized by: Earley Favor   Consent:    Consent obtained:  Verbal   Consent given by:  Patient   Risks discussed:  Infection and pain   Alternatives discussed:  No treatment Anesthesia (see MAR for exact dosages):    Anesthesia method:  Local infiltration   Local anesthetic:  Lidocaine 1% w/o epi Laceration details:    Location:  Shoulder/arm   Shoulder/arm location:  L lower arm   Length (cm):  1   Depth (mm):  0.5 Repair type:    Repair type:  Simple Pre-procedure details:    Preparation:  Patient was prepped and draped in usual sterile fashion Exploration:    Wound exploration: entire depth of wound probed and visualized     Contaminated: no   Treatment:      Area cleansed with:  Saline   Amount of cleaning:  Standard   Irrigation solution:  Sterile saline   Irrigation volume:  20   Irrigation method:  Syringe   Visualized foreign bodies/material removed: no   Skin repair:    Repair method:  Sutures Approximation:    Approximation:  Close   Vermilion border: well-aligned   Post-procedure details:    Dressing:  Antibiotic ointment and non-adherent dressing   Patient tolerance of procedure:  Tolerated well, no immediate complications   (including critical care time)  Medications Ordered in ED Medications  lidocaine (PF) (XYLOCAINE) 1 % injection 2 mL (not administered)  Tdap (BOOSTRIX) injection 0.5 mL (not administered)  lidocaine (PF) (XYLOCAINE) 1 % injection (not administered)     Initial Impression / Assessment and Plan / ED Course  I have reviewed the triage vital signs and the nursing notes.  Pertinent labs & imaging results that were available during my care of the patient were reviewed by me and considered in my medical decision making (see chart for details).      Patient is neurovascularly intact.  Full range of motion of the wrist and fingers.  Sutures have been placed without complication.  She's been instructed to follow-up with her PCP in 10 days for suture  Final Clinical Impressions(s) / ED Diagnoses   Final diagnoses:  Laceration of arm, left, initial encounter    New Prescriptions New Prescriptions   No medications on file     Earley Favor, NP 11/17/16 0522    Melene Plan, DO 11/17/16 [phone]

## 2016-11-17 NOTE — Discharge Instructions (Signed)
Keep the suture line clean and dry.  Wash with soap and water daily, apply a small amount of antibiotic ointment as needed, while at work.  Make sure to keep the area covered with a Band-Aid

## 2018-03-11 DIAGNOSIS — F988 Other specified behavioral and emotional disorders with onset usually occurring in childhood and adolescence: Secondary | ICD-10-CM | POA: Insufficient documentation

## 2018-03-21 ENCOUNTER — Ambulatory Visit: Payer: Self-pay | Admitting: Physician Assistant

## 2018-03-29 ENCOUNTER — Other Ambulatory Visit: Payer: Self-pay | Admitting: Physician Assistant

## 2018-03-29 MED ORDER — AMPHETAMINE-DEXTROAMPHETAMINE 10 MG PO TABS: 10.0000 mg | ORAL_TABLET | Freq: Every day | ORAL | 0 refills | Status: DC

## 2018-03-29 MED ORDER — AMPHETAMINE-DEXTROAMPHETAMINE 30 MG PO TABS: 30.0000 mg | ORAL_TABLET | Freq: Every day | ORAL | 0 refills | Status: DC

## 2018-04-29 ENCOUNTER — Encounter: Payer: Self-pay | Admitting: Physician Assistant

## 2018-04-29 ENCOUNTER — Ambulatory Visit (INDEPENDENT_AMBULATORY_CARE_PROVIDER_SITE_OTHER): Admitting: Physician Assistant

## 2018-04-29 VITALS — BP 133/84 | HR 83

## 2018-04-29 DIAGNOSIS — F909 Attention-deficit hyperactivity disorder, unspecified type: Secondary | ICD-10-CM | POA: Diagnosis not present

## 2018-04-29 DIAGNOSIS — F411 Generalized anxiety disorder: Secondary | ICD-10-CM | POA: Diagnosis not present

## 2018-04-29 MED ORDER — AMPHETAMINE-DEXTROAMPHETAMINE 15 MG PO TABS: 15.0000 mg | ORAL_TABLET | Freq: Every day | ORAL | 0 refills | Status: DC

## 2018-04-29 MED ORDER — AMPHETAMINE-DEXTROAMPHETAMINE 30 MG PO TABS: 30.0000 mg | ORAL_TABLET | Freq: Every day | ORAL | 0 refills | Status: DC

## 2018-04-29 NOTE — Progress Notes (Signed)
[facility] Med Check  Patient ID: Katherine BarbaraKristen Kehres,  MRN: [id number]  PCP: Wilfrid LundBecker, Anna G, PA  Date of Evaluation: 04/29/2018 Time spent:15 minutes  Chief Complaint:  Chief Complaint    Follow-up      HISTORY/CURRENT STATUS: HPI here for 2566-month ADD med check.  Things are going pretty well. States that attention is good without easy distractibility.  Able to focus on things and finish tasks to completion.  Work is going well.  She sleeps well.  The only problem that she notices is that the 10 mg of Adderall in the middle of the day does not seem to help as much as it did in the past.  She feels like it is barely effective and those effects do not last very long.  Anxiety is well controlled.  No meds are needed.  Individual Medical History/ Review of Systems: Changes? :No    Past medications for mental health diagnoses include: None except Adderall  Allergies: Patient has no known allergies.  Current Medications:  Current Outpatient Medications:  .  amphetamine-dextroamphetamine (ADDERALL) 10 MG tablet, Take 10 mg by mouth daily with lunch., Disp: , Rfl:  .  amphetamine-dextroamphetamine (ADDERALL) 30 MG tablet, Take 30 mg by mouth daily., Disp: , Rfl:  .  levonorgestrel (MIRENA) 20 MCG/24HR IUD, 1 each by Intrauterine route once., Disp: , Rfl:  .  amphetamine-dextroamphetamine (ADDERALL) 15 MG tablet, Take 1 tablet by mouth daily at 12 noon., Disp: 30 tablet, Rfl: 0 .  [START ON 05/28/2018] amphetamine-dextroamphetamine (ADDERALL) 15 MG tablet, Take 1 tablet by mouth daily at 12 noon., Disp: 30 tablet, Rfl: 0 .  [START ON 06/27/2018] amphetamine-dextroamphetamine (ADDERALL) 15 MG tablet, Take 1 tablet by mouth daily at 12 noon., Disp: 30 tablet, Rfl: 0 .  amphetamine-dextroamphetamine (ADDERALL) 30 MG tablet, Take 1 tablet by mouth daily., Disp: 30 tablet, Rfl: 0 .  [START ON 05/28/2018] amphetamine-dextroamphetamine (ADDERALL) 30 MG tablet, Take 1 tablet by mouth daily.,  Disp: 30 tablet, Rfl: 0 .  [START ON 06/27/2018] amphetamine-dextroamphetamine (ADDERALL) 30 MG tablet, Take 1 tablet by mouth daily., Disp: 30 tablet, Rfl: 0 Medication Side Effects: none  Family Medical/ Social History: Changes? No  MENTAL HEALTH EXAM:  Blood pressure 133/84, pulse 83.There is no height or weight on file to calculate BMI.  General Appearance: Casual  Eye Contact:  Good  Speech:  Clear and Coherent  Volume:  Normal  Mood:  Euthymic  Affect:  Appropriate  Thought Process:  Goal Directed  Orientation:  Full (Time, Place, and Person)  Thought Content: Logical   Suicidal Thoughts:  No  Homicidal Thoughts:  No  Memory:  WNL  Judgement:  Good  Insight:  Good  Psychomotor Activity:  Normal  Concentration:  Concentration: Good  Recall:  Good  Fund of Knowledge: Good  Language: Good  Assets:  Desire for Improvement  ADL's:  Intact  Cognition: WNL  Prognosis:  Good    DIAGNOSES:    ICD-10-CM   1. Attention deficit hyperactivity disorder (ADHD), unspecified ADHD type F90.9   2. Generalized anxiety disorder F41.1     Receiving Psychotherapy: No    RECOMMENDATIONS: Continue Adderall 30 mg every morning.  Will increase mid day dose to 15 mg.  She can take around noon to 2:00 if needed.  She will let me know if it is not effective.  I am giving her a 8266-month prescription on both.  As long as she is doing well, she can call in 3 months and I  will prescribe her the next 45-month medication. Return in 6 months or sooner as needed.   Melony Overly, PA-C

## 2018-08-21 ENCOUNTER — Other Ambulatory Visit: Payer: Self-pay

## 2018-08-21 ENCOUNTER — Telehealth: Payer: Self-pay | Admitting: Physician Assistant

## 2018-08-21 MED ORDER — AMPHETAMINE-DEXTROAMPHETAMINE 30 MG PO TABS: 30.0000 mg | ORAL_TABLET | Freq: Every day | ORAL | 0 refills | Status: DC

## 2018-08-21 MED ORDER — AMPHETAMINE-DEXTROAMPHETAMINE 15 MG PO TABS: 15.0000 mg | ORAL_TABLET | Freq: Every day | ORAL | 0 refills | Status: DC

## 2018-08-21 NOTE — Progress Notes (Unsigned)
Refill request for both Adderall 30mg  and 15 mg  Last office visit 04/2018 Next office visit 10/31/2018

## 2018-08-21 NOTE — Telephone Encounter (Signed)
Pt called need new Rx for both Adderall  15 mg & 30 mg. [facility].

## 2018-08-21 NOTE — Telephone Encounter (Signed)
rxs submitted to provider for approval

## 2018-08-27 ENCOUNTER — Other Ambulatory Visit: Payer: Self-pay

## 2018-10-04 ENCOUNTER — Other Ambulatory Visit: Payer: Self-pay

## 2018-10-04 ENCOUNTER — Telehealth: Payer: Self-pay | Admitting: Physician Assistant

## 2018-10-04 MED ORDER — AMPHETAMINE-DEXTROAMPHETAMINE 15 MG PO TABS: 15.0000 mg | ORAL_TABLET | Freq: Every day | ORAL | 0 refills | Status: DC

## 2018-10-04 MED ORDER — AMPHETAMINE-DEXTROAMPHETAMINE 30 MG PO TABS: 30.0000 mg | ORAL_TABLET | Freq: Every day | ORAL | 0 refills | Status: DC

## 2018-10-04 NOTE — Telephone Encounter (Signed)
Patient called and said that she needs a refill on her adderrall 15 mg and 30 mg to be sent to the [facility] on [address]. Her next appt is 5/28

## 2018-10-04 NOTE — Telephone Encounter (Signed)
Submitted to provider for approval 

## 2018-10-31 ENCOUNTER — Other Ambulatory Visit: Payer: Self-pay

## 2018-10-31 ENCOUNTER — Ambulatory Visit (INDEPENDENT_AMBULATORY_CARE_PROVIDER_SITE_OTHER): Admitting: Physician Assistant

## 2018-10-31 ENCOUNTER — Encounter: Payer: Self-pay | Admitting: Physician Assistant

## 2018-10-31 DIAGNOSIS — F411 Generalized anxiety disorder: Secondary | ICD-10-CM | POA: Diagnosis not present

## 2018-10-31 DIAGNOSIS — F909 Attention-deficit hyperactivity disorder, unspecified type: Secondary | ICD-10-CM

## 2018-10-31 MED ORDER — AMPHETAMINE-DEXTROAMPHETAMINE 15 MG PO TABS: 15.0000 mg | ORAL_TABLET | Freq: Every day | ORAL | 0 refills | Status: DC

## 2018-10-31 MED ORDER — AMPHETAMINE-DEXTROAMPHETAMINE 30 MG PO TABS: 30.0000 mg | ORAL_TABLET | Freq: Every day | ORAL | 0 refills | Status: DC

## 2018-10-31 NOTE — Progress Notes (Signed)
[facility]  Patient ID: Katherine Keith,  MRN: [id number]  PCP: Wilfrid Lund, PA  Date of Evaluation: 10/31/2018 Time spent:15 minutes  Chief Complaint:  Chief Complaint    ADHD; Follow-up     Virtual Visit via Telephone Note  I connected with patient by a video enabled telemedicine application or telephone, with their informed consent, and verified patient privacy and that I am speaking with the correct person using two identifiers.  I am private, at [place] and the patient is at home.  I discussed the limitations, risks, security and privacy concerns of performing an evaluation and management service by telephone and the availability of in person appointments. I also discussed with the patient that there may be a patient responsible charge related to this service. The patient expressed understanding and agreed to proceed.   I discussed the assessment and treatment plan with the patient. The patient was provided an opportunity to ask questions and all were answered. The patient agreed with the plan and demonstrated an understanding of the instructions.   The patient was advised to call back or seek an in-person evaluation if the symptoms worsen or if the condition fails to improve as anticipated.  I provided 15 minutes of non-face-to-face time during this encounter.  HISTORY/CURRENT STATUS: HPI for 59-month med check for ADHD.  Patient states she is doing very well.  States that attention is good without easy distractibility.  Able to focus on things and finish tasks to completion.   Due to the coronavirus pandemic, her son has been doing online classes.  She has been staying busy with him and the changes in the household dynamics because of that.  Denies dizziness, syncope, seizures, numbness, tingling, tremor, tics, unsteady gait, slurred speech, confusion. Denies muscle or joint pain, stiffness, or dystonia.  Individual Medical History/ Review of Systems:  Changes? :No   Allergies: Patient has no known allergies.  Current Medications:  Current Outpatient Medications:  .  [START ON 12/29/2018] amphetamine-dextroamphetamine (ADDERALL) 15 MG tablet, Take 1 tablet by mouth daily at 12 noon., Disp: 30 tablet, Rfl: 0 .  [START ON 11/30/2018] amphetamine-dextroamphetamine (ADDERALL) 15 MG tablet, Take 1 tablet by mouth daily at 12 noon., Disp: 30 tablet, Rfl: 0 .  amphetamine-dextroamphetamine (ADDERALL) 15 MG tablet, Take 1 tablet by mouth daily at 12 noon., Disp: 30 tablet, Rfl: 0 .  [START ON 12/29/2018] amphetamine-dextroamphetamine (ADDERALL) 30 MG tablet, Take 1 tablet by mouth daily., Disp: 30 tablet, Rfl: 0 .  [START ON 11/30/2018] amphetamine-dextroamphetamine (ADDERALL) 30 MG tablet, Take 1 tablet by mouth daily., Disp: 30 tablet, Rfl: 0 .  amphetamine-dextroamphetamine (ADDERALL) 30 MG tablet, Take 1 tablet by mouth daily., Disp: 30 tablet, Rfl: 0 .  levonorgestrel (MIRENA) 20 MCG/24HR IUD, 1 each by Intrauterine route once., Disp: , Rfl:  .  amphetamine-dextroamphetamine (ADDERALL) 10 MG tablet, Take 10 mg by mouth daily with lunch., Disp: , Rfl:  Medication Side Effects: none  Family Medical/ Social History: Changes? Yes see HPI  MENTAL HEALTH EXAM:  There were no vitals taken for this visit.There is no height or weight on file to calculate BMI.  General Appearance: Unable to assess  Eye Contact:  Unable to assess  Speech:  Clear and Coherent  Volume:  Normal  Mood:  Euthymic  Affect:  Unable to assess  Thought Process:  Goal Directed  Orientation:  Full (Time, Place, and Person)  Thought Content: Logical   Suicidal Thoughts:  No  Homicidal Thoughts:  No  Memory:  WNL  Judgement:  Good  Insight:  Good  Psychomotor Activity:  Unable to assess  Concentration:  Concentration: Good and Attention Span: Good  Recall:  Good  Fund of Knowledge: Good  Language: Good  Assets:  Desire for Improvement  ADL's:  Intact  Cognition: WNL   Prognosis:  Good    DIAGNOSES:    ICD-10-CM   1. Attention deficit hyperactivity disorder (ADHD), unspecified ADHD type F90.9   2. Generalized anxiety disorder F41.1     Receiving Psychotherapy: No    RECOMMENDATIONS:  Continue Adderall 30 mg p.o. every morning. Continue Adderall 15 mg q. afternoon as needed. Return in 6 months.  Melony Overlyeresa , PA-C   This record has been created using [employer] software.  Chart creation errors have been sought, but may not always have been located and corrected. Such creation errors do not reflect on the standard of medical care.

## 2019-01-07 ENCOUNTER — Other Ambulatory Visit: Payer: Self-pay | Admitting: Nurse Practitioner

## 2019-01-07 ENCOUNTER — Ambulatory Visit
Admission: RE | Admit: 2019-01-07 | Discharge: 2019-01-07 | Disposition: A | Discharge status: Home / Self Care | Payer: No Typology Code available for payment source | Source: Ambulatory Visit | Attending: Nurse Practitioner | Admitting: Nurse Practitioner

## 2019-01-07 DIAGNOSIS — Z021 Encounter for pre-employment examination: Secondary | ICD-10-CM

## 2019-02-12 ENCOUNTER — Other Ambulatory Visit: Payer: Self-pay

## 2019-02-12 MED ORDER — AMPHETAMINE-DEXTROAMPHETAMINE 15 MG PO TABS: 15.0000 mg | ORAL_TABLET | Freq: Every day | ORAL | 0 refills | Status: DC

## 2019-02-12 MED ORDER — AMPHETAMINE-DEXTROAMPHETAMINE 30 MG PO TABS: 30.0000 mg | ORAL_TABLET | Freq: Every day | ORAL | 0 refills | Status: DC

## 2019-02-13 ENCOUNTER — Telehealth: Payer: Self-pay | Admitting: Physician Assistant

## 2019-02-13 NOTE — Telephone Encounter (Signed)
Error

## 2019-05-14 ENCOUNTER — Telehealth: Payer: Self-pay | Admitting: Physician Assistant

## 2019-05-14 ENCOUNTER — Other Ambulatory Visit: Payer: Self-pay

## 2019-05-14 NOTE — Telephone Encounter (Signed)
Last refill on both strengths 11/09, pended for approval. Apt is due.

## 2019-05-14 NOTE — Telephone Encounter (Signed)
Pt requested refill for ADDERALL  15 mg and 30 mg at [facility] on file. Called Pt to advise 6 mos follow up due need to call office to schedule.

## 2019-05-15 MED ORDER — AMPHETAMINE-DEXTROAMPHETAMINE 15 MG PO TABS: 15.0000 mg | ORAL_TABLET | Freq: Every day | ORAL | 0 refills | Status: DC

## 2019-05-15 MED ORDER — AMPHETAMINE-DEXTROAMPHETAMINE 30 MG PO TABS: 30.0000 mg | ORAL_TABLET | Freq: Every day | ORAL | 0 refills | Status: DC

## 2019-06-30 ENCOUNTER — Ambulatory Visit: Admitting: Physician Assistant

## 2019-06-30 ENCOUNTER — Telehealth: Payer: Self-pay | Admitting: Physician Assistant

## 2019-06-30 ENCOUNTER — Other Ambulatory Visit: Payer: Self-pay | Admitting: Physician Assistant

## 2019-06-30 MED ORDER — AMPHETAMINE-DEXTROAMPHETAMINE 30 MG PO TABS: 30.0000 mg | ORAL_TABLET | Freq: Every day | ORAL | 0 refills | Status: DC

## 2019-06-30 MED ORDER — AMPHETAMINE-DEXTROAMPHETAMINE 15 MG PO TABS: 15.0000 mg | ORAL_TABLET | Freq: Every day | ORAL | 0 refills | Status: DC

## 2019-06-30 NOTE — Telephone Encounter (Signed)
Rx sent 

## 2019-06-30 NOTE — Telephone Encounter (Signed)
Patient thought her appt today was telehealth but we didn't have it down like that. She rescheduled for feb 16 th but needs a refills on her adderal 15 mg and 30 mg to [facility] on [address]

## 2019-06-30 NOTE — Progress Notes (Signed)
New Rx sent for Adderall, both strengths.  Pt missed appt today/rescheduled.

## 2019-07-22 ENCOUNTER — Ambulatory Visit (INDEPENDENT_AMBULATORY_CARE_PROVIDER_SITE_OTHER): Admitting: Physician Assistant

## 2019-07-22 ENCOUNTER — Encounter: Payer: Self-pay | Admitting: Physician Assistant

## 2019-07-22 DIAGNOSIS — F909 Attention-deficit hyperactivity disorder, unspecified type: Secondary | ICD-10-CM

## 2019-07-22 MED ORDER — AMPHETAMINE-DEXTROAMPHETAMINE 15 MG PO TABS: 15.0000 mg | ORAL_TABLET | Freq: Every day | ORAL | 0 refills | Status: DC

## 2019-07-22 MED ORDER — AMPHETAMINE-DEXTROAMPHETAMINE 30 MG PO TABS: 30.0000 mg | ORAL_TABLET | Freq: Every day | ORAL | 0 refills | Status: DC

## 2019-07-22 NOTE — Progress Notes (Signed)
[facility]  Patient ID: Katherine Keith,  MRN: [id number]  PCP: Lois Huxley, PA  Date of Evaluation: 07/22/2019 Time spent:15 minutes  Chief Complaint:  Chief Complaint    ADD; Medication Refill     Virtual Visit via Telephone Note  I connected with patient by a video enabled telemedicine application or telephone, with their informed consent, and verified patient privacy and that I am speaking with the correct person using two identifiers.  I am private, in my office and the patient is home.   I discussed the limitations, risks, security and privacy concerns of performing an evaluation and management service by telephone and the availability of in person appointments. I also discussed with the patient that there may be a patient responsible charge related to this service. The patient expressed understanding and agreed to proceed.   I discussed the assessment and treatment plan with the patient. The patient was provided an opportunity to ask questions and all were answered. The patient agreed with the plan and demonstrated an understanding of the instructions.   The patient was advised to call back or seek an in-person evaluation if the symptoms worsen or if the condition fails to improve as anticipated.  I provided 15 minutes of non-face-to-face time during this encounter.  HISTORY/CURRENT STATUS: HPI For routine med check.   Doing well.  The Adderall is working well.  Able to concentrate and finish things without distracted too easily.  There are occasional days when she does not take the Adderall but she usually needs it every day.  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  Reports no side effects from the medications.  Patient denies loss of interest in usual activities and is able to enjoy things.  Denies decreased energy or motivation.  Appetite has not changed.  No extreme sadness, tearfulness, or  feelings of hopelessness.  Denies any changes in concentration, making decisions or remembering things.  Denies suicidal or homicidal thoughts.  Denies symptoms of anxiety.  Of course she does get nervous before a test or something like that but that is normal.  Individual Medical History/ Review of Systems: Changes? :No   Allergies: Patient has no known allergies.  Current Medications:  Current Outpatient Medications:  .  [START ON 10/16/2019] amphetamine-dextroamphetamine (ADDERALL) 15 MG tablet, Take 1 tablet by mouth daily at 12 noon., Disp: 30 tablet, Rfl: 0 .  [START ON 09/17/2019] amphetamine-dextroamphetamine (ADDERALL) 15 MG tablet, Take 1 tablet by mouth daily at 12 noon., Disp: 30 tablet, Rfl: 0 .  [START ON 08/18/2019] amphetamine-dextroamphetamine (ADDERALL) 15 MG tablet, Take 1 tablet by mouth daily at 12 noon., Disp: 30 tablet, Rfl: 0 .  [START ON 10/16/2019] amphetamine-dextroamphetamine (ADDERALL) 30 MG tablet, Take 1 tablet by mouth daily., Disp: 30 tablet, Rfl: 0 .  [START ON 09/17/2019] amphetamine-dextroamphetamine (ADDERALL) 30 MG tablet, Take 1 tablet by mouth daily., Disp: 30 tablet, Rfl: 0 .  [START ON 08/18/2019] amphetamine-dextroamphetamine (ADDERALL) 30 MG tablet, Take 1 tablet by mouth daily., Disp: 30 tablet, Rfl: 0 .  levonorgestrel (MIRENA) 20 MCG/24HR IUD, 1 each by Intrauterine route once., Disp: , Rfl:  .  Multiple Vitamin (MULTIVITAMIN) tablet, Take 1 tablet by mouth daily., Disp: , Rfl:  Medication Side Effects: none  Family Medical/ Social History: Changes? Yes see above  MENTAL HEALTH EXAM:  There were no vitals taken for this visit.There is no height or weight on file to calculate  BMI.  General Appearance: Unable to assess  Eye Contact:  Unable to assess  Speech:  Clear and Coherent  Volume:  Normal  Mood:  Euthymic  Affect:  Unable to assess  Thought Process:  Goal Directed and Descriptions of Associations: Intact  Orientation:  Full (Time, Place,  and Person)  Thought Content: Logical   Suicidal Thoughts:  No  Homicidal Thoughts:  No  Memory:  WNL  Judgement:  Good  Insight:  Good  Psychomotor Activity:  Unable to assess  Concentration:  Concentration: Good and Attention Span: Good  Recall:  Good  Fund of Knowledge: Good  Language: Good  Assets:  Desire for Improvement  ADL's:  Intact  Cognition: WNL  Prognosis:  Good    DIAGNOSES:    ICD-10-CM   1. Attention deficit hyperactivity disorder (ADHD), unspecified ADHD type  F90.9     Receiving Psychotherapy: No    RECOMMENDATIONS:  I am glad to see her doing so well. I spent 15 minutes with her. PDMP was reviewed.  She has a prescription ready at the pharmacy that she will pick up later today.  The 3 prescriptions I am sending in today will be available beginning in March and then every 30 days thereafter. Continue Adderall 30 mg every morning. Continue Adderall 15 mg daily around noon. Return in 6 months.  Melony Overly, PA-C

## 2019-11-28 ENCOUNTER — Telehealth: Payer: Self-pay | Admitting: Physician Assistant

## 2019-11-28 ENCOUNTER — Other Ambulatory Visit: Payer: Self-pay

## 2019-11-28 MED ORDER — AMPHETAMINE-DEXTROAMPHETAMINE 15 MG PO TABS: 15.0000 mg | ORAL_TABLET | Freq: Every day | ORAL | 0 refills | Status: DC

## 2019-11-28 MED ORDER — AMPHETAMINE-DEXTROAMPHETAMINE 30 MG PO TABS: 30.0000 mg | ORAL_TABLET | Freq: Every day | ORAL | 0 refills | Status: DC

## 2019-11-28 NOTE — Telephone Encounter (Signed)
Last refill for both 10/23/19 Pended both strength's 3 Rx's each for Katherine Keith to send Apt 01/19/2020

## 2019-11-28 NOTE — Telephone Encounter (Signed)
Patient called and said that she needs refills 3 month supply for her adderall 15 mg and 30 mg to [facility] on [address]

## 2020-01-19 ENCOUNTER — Encounter (INDEPENDENT_AMBULATORY_CARE_PROVIDER_SITE_OTHER): Payer: Self-pay

## 2020-01-19 ENCOUNTER — Other Ambulatory Visit: Payer: Self-pay

## 2020-01-19 ENCOUNTER — Ambulatory Visit (INDEPENDENT_AMBULATORY_CARE_PROVIDER_SITE_OTHER): Admitting: Physician Assistant

## 2020-01-19 ENCOUNTER — Encounter: Payer: Self-pay | Admitting: Physician Assistant

## 2020-01-19 VITALS — BP 125/75 | HR 76

## 2020-01-19 DIAGNOSIS — F909 Attention-deficit hyperactivity disorder, unspecified type: Secondary | ICD-10-CM | POA: Diagnosis not present

## 2020-01-19 DIAGNOSIS — F411 Generalized anxiety disorder: Secondary | ICD-10-CM

## 2020-01-19 MED ORDER — AMPHETAMINE-DEXTROAMPHETAMINE 15 MG PO TABS: 15.0000 mg | ORAL_TABLET | Freq: Every day | ORAL | 0 refills | Status: DC

## 2020-01-19 MED ORDER — AMPHETAMINE-DEXTROAMPHETAMINE 30 MG PO TABS: 30.0000 mg | ORAL_TABLET | Freq: Every day | ORAL | 0 refills | Status: DC

## 2020-01-19 NOTE — Progress Notes (Signed)
[facility]  Patient ID: Devika Dragovich,  MRN: [id number]  PCP: Wilfrid Lund, PA  Date of Evaluation: 01/19/2020 Time spent:20 minutes  Chief Complaint:  Chief Complaint    ADHD      HISTORY/CURRENT STATUS: HPI For routine med check.   Doing well.  The Adderall is working well.  Able to concentrate and finish things without distracted too easily.    Patient denies loss of interest in usual activities and is able to enjoy things.  Denies decreased energy or motivation.  Appetite has not changed.  No extreme sadness, tearfulness, or feelings of hopelessness.  Anxiety is not a huge problem.  If there is a trigger, she will become anxious, over a test or something like that.  Denies suicidal or homicidal thoughts.  Denies dizziness, syncope, seizures, numbness, tingling, tremor, tics, unsteady gait, slurred speech, confusion. Denies muscle or joint pain, stiffness, or dystonia.  Individual Medical History/ Review of Systems: Changes? :Yes She was stung by some sort of wasp, on her left forearm a few days ago.  Her arm became very swollen and red.  She had to go to the urgent care and was given steroids for a short course.  Patient states it is much better.  Past medications for mental health diagnoses include: Unknown  Allergies: Patient has no known allergies.  Current Medications:  Current Outpatient Medications:  .  [START ON 04/01/2020] amphetamine-dextroamphetamine (ADDERALL) 15 MG tablet, Take 1 tablet by mouth daily at 12 noon., Disp: 30 tablet, Rfl: 0 .  [START ON 03/03/2020] amphetamine-dextroamphetamine (ADDERALL) 15 MG tablet, Take 1 tablet by mouth daily at 12 noon., Disp: 30 tablet, Rfl: 0 .  [START ON 02/02/2020] amphetamine-dextroamphetamine (ADDERALL) 15 MG tablet, Take 1 tablet by mouth daily at 12 noon., Disp: 30 tablet, Rfl: 0 .  [START ON 04/01/2020] amphetamine-dextroamphetamine (ADDERALL) 30 MG tablet, Take 1 tablet by mouth daily., Disp: 30  tablet, Rfl: 0 .  [START ON 03/03/2020] amphetamine-dextroamphetamine (ADDERALL) 30 MG tablet, Take 1 tablet by mouth daily., Disp: 30 tablet, Rfl: 0 .  [START ON 02/02/2020] amphetamine-dextroamphetamine (ADDERALL) 30 MG tablet, Take 1 tablet by mouth daily., Disp: 30 tablet, Rfl: 0 .  ferrous sulfate 325 (65 FE) MG EC tablet, Take 325 mg by mouth 3 (three) times daily with meals., Disp: , Rfl:  .  levonorgestrel (MIRENA) 20 MCG/24HR IUD, 1 each by Intrauterine route once., Disp: , Rfl:  .  Multiple Vitamin (MULTIVITAMIN) tablet, Take 1 tablet by mouth daily., Disp: , Rfl:  Medication Side Effects: none  Family Medical/ Social History: Changes?  No MENTAL HEALTH EXAM:  Blood pressure 125/75, pulse 76.There is no height or weight on file to calculate BMI.  General Appearance: Casual, Neat and Well Groomed left anterior forearm shows diffuse redness and slight warmth.  But no edema.  She states it is a lot better than it was.  Eye Contact:  Good  Speech:  Clear and Coherent  Volume:  Normal  Mood:  Euthymic  Affect:  Appropriate  Thought Process:  Goal Directed and Descriptions of Associations: Intact  Orientation:  Full (Time, Place, and Person)  Thought Content: Logical   Suicidal Thoughts:  No  Homicidal Thoughts:  No  Memory:  WNL  Judgement:  Good  Insight:  Good  Psychomotor Activity:  Normal  Concentration:  Concentration: Good and Attention Span: Good  Recall:  Good  Fund of Knowledge: Good  Language: Good  Assets:  Desire for Improvement  ADL's:  Intact  Cognition: WNL  Prognosis:  Good    DIAGNOSES:    ICD-10-CM   1. Attention deficit hyperactivity disorder (ADHD), unspecified ADHD type  F90.9   2. Generalized anxiety disorder  F41.1     Receiving Psychotherapy: No    RECOMMENDATIONS:  PDMP was reviewed. I provided 20 minutes of face-to-face time during this encounter. Continue Adderall 30 mg every morning. Continue Adderall 15 mg daily around noon. Return in  6 months.  Melony Overly, PA-C

## 2020-07-06 ENCOUNTER — Other Ambulatory Visit: Payer: Self-pay | Admitting: Physician Assistant

## 2020-07-06 ENCOUNTER — Telehealth: Payer: Self-pay | Admitting: Physician Assistant

## 2020-07-06 MED ORDER — AMPHETAMINE-DEXTROAMPHETAMINE 15 MG PO TABS: 15.0000 mg | ORAL_TABLET | Freq: Every day | ORAL | 0 refills | Status: DC

## 2020-07-06 MED ORDER — AMPHETAMINE-DEXTROAMPHETAMINE 30 MG PO TABS: 30.0000 mg | ORAL_TABLET | Freq: Every day | ORAL | 0 refills | Status: DC

## 2020-07-06 NOTE — Telephone Encounter (Signed)
Prescriptions were sent

## 2020-07-06 NOTE — Telephone Encounter (Signed)
Pt would like a refill on Adderall 15mg  and 30mg . Please send to [facility] on [address].

## 2020-07-21 ENCOUNTER — Encounter: Payer: Self-pay | Admitting: Physician Assistant

## 2020-07-21 ENCOUNTER — Other Ambulatory Visit: Payer: Self-pay

## 2020-07-21 ENCOUNTER — Ambulatory Visit (INDEPENDENT_AMBULATORY_CARE_PROVIDER_SITE_OTHER): Admitting: Physician Assistant

## 2020-07-21 VITALS — BP 116/71 | HR 74

## 2020-07-21 DIAGNOSIS — F909 Attention-deficit hyperactivity disorder, unspecified type: Secondary | ICD-10-CM | POA: Diagnosis not present

## 2020-07-21 MED ORDER — AMPHETAMINE-DEXTROAMPHETAMINE 15 MG PO TABS: 15.0000 mg | ORAL_TABLET | Freq: Every day | ORAL | 0 refills | Status: DC

## 2020-07-21 MED ORDER — AMPHETAMINE-DEXTROAMPHETAMINE 30 MG PO TABS: 30.0000 mg | ORAL_TABLET | Freq: Every day | ORAL | 0 refills | Status: DC

## 2020-07-21 NOTE — Progress Notes (Signed)
[facility]  Patient ID: Katherine Keith,  MRN: [id number]  PCP: Wilfrid Lund, PA  Date of Evaluation: 07/21/2020 Time spent:20 minutes  Chief Complaint:  Chief Complaint    ADD      HISTORY/CURRENT STATUS:  HPI For routine med check.   Doing well for the most part.  Sometimes the Adderall does not work as well as it previously has but it is very random.  Most of the time it works well.  She is able to get things done without being distracted.  Work is going well.  Patient denies loss of interest in usual activities and is able to enjoy things.  Denies decreased energy or motivation.  Appetite has not changed.  No extreme sadness, tearfulness, or feelings of hopelessness.  Anxiety is not a huge problem. Denies suicidal or homicidal thoughts.  Denies dizziness, syncope, seizures, numbness, tingling, tremor, tics, unsteady gait, slurred speech, confusion. Denies muscle or joint pain, stiffness, or dystonia.  Individual Medical History/ Review of Systems: Changes? :No    Past medications for mental health diagnoses include: Unknown  Allergies: Patient has no known allergies.  Current Medications:  Current Outpatient Medications:  .  ferrous sulfate 325 (65 FE) MG EC tablet, Take 325 mg by mouth 3 (three) times daily with meals., Disp: , Rfl:  .  levonorgestrel (MIRENA) 20 MCG/24HR IUD, 1 each by Intrauterine route once., Disp: , Rfl:  .  Multiple Vitamin (MULTIVITAMIN) tablet, Take 1 tablet by mouth daily., Disp: , Rfl:  .  [START ON 10/01/2020] amphetamine-dextroamphetamine (ADDERALL) 15 MG tablet, Take 1 tablet by mouth daily at 12 noon., Disp: 30 tablet, Rfl: 0 .  [START ON 09/01/2020] amphetamine-dextroamphetamine (ADDERALL) 15 MG tablet, Take 1 tablet by mouth daily at 12 noon., Disp: 30 tablet, Rfl: 0 .  [START ON 08/03/2020] amphetamine-dextroamphetamine (ADDERALL) 15 MG tablet, Take 1 tablet by mouth daily at 12 noon., Disp: 30 tablet, Rfl: 0 .  [START ON  10/01/2020] amphetamine-dextroamphetamine (ADDERALL) 30 MG tablet, Take 1 tablet by mouth daily., Disp: 30 tablet, Rfl: 0 .  [START ON 09/01/2020] amphetamine-dextroamphetamine (ADDERALL) 30 MG tablet, Take 1 tablet by mouth daily., Disp: 30 tablet, Rfl: 0 .  [START ON 08/03/2020] amphetamine-dextroamphetamine (ADDERALL) 30 MG tablet, Take 1 tablet by mouth daily., Disp: 30 tablet, Rfl: 0 Medication Side Effects: none  Family Medical/ Social History: Changes?  No  MENTAL HEALTH EXAM:  Blood pressure 116/71, pulse 74.There is no height or weight on file to calculate BMI.  General: Casual, well groomed  Eye Contact:  Good  Speech:  Clear and Coherent  Volume:  Normal  Mood:  Euthymic  Affect:  Appropriate  Thought Process:  Goal Directed and Descriptions of Associations: Intact  Orientation:  Full (Time, Place, and Person)  Thought Content: Logical   Suicidal Thoughts:  No  Homicidal Thoughts:  No  Memory:  WNL  Judgement:  Good  Insight:  Good  Psychomotor Activity:  Normal  Concentration:  Concentration: Good and Attention Span: Good  Recall:  Good  Fund of Knowledge: Good  Language: Good  Assets:  Desire for Improvement  ADL's:  Intact  Cognition: WNL  Prognosis:  Good    DIAGNOSES:    ICD-10-CM   1. Attention deficit hyperactivity disorder (ADHD), unspecified ADHD type  F90.9     Receiving Psychotherapy: No    RECOMMENDATIONS:  PDMP was reviewed. I provided 20 minutes of face-to-face time during this encounter discussing different options for the ADHD.  Most of  the time the Adderall is working well so no changes will be made.  However we briefly discussed increasing the dose.  If she does need to do that, she can call and we will bump it up over the phone.  Of course I would want to see her sooner than our usual interim. Continue Adderall 30 mg every morning. Continue Adderall 15 mg daily around noon. Return in 6 months.  Melony Overly, PA-C

## 2020-11-16 ENCOUNTER — Other Ambulatory Visit: Payer: Self-pay

## 2020-11-16 ENCOUNTER — Telehealth: Payer: Self-pay | Admitting: Physician Assistant

## 2020-11-16 MED ORDER — AMPHETAMINE-DEXTROAMPHETAMINE 15 MG PO TABS: 15.0000 mg | ORAL_TABLET | Freq: Every day | ORAL | 0 refills | Status: DC

## 2020-11-16 MED ORDER — AMPHETAMINE-DEXTROAMPHETAMINE 30 MG PO TABS: 30.0000 mg | ORAL_TABLET | Freq: Every day | ORAL | 0 refills | Status: DC

## 2020-11-16 NOTE — Telephone Encounter (Signed)
Pt called and said that she needs refills on her adderall 15 mg and adderall 30 mg to be [facility] to the [facility] on [address]. Her next appt is in august

## 2020-11-16 NOTE — Telephone Encounter (Signed)
pended

## 2020-12-15 ENCOUNTER — Telehealth: Payer: Self-pay | Admitting: Physician Assistant

## 2020-12-15 ENCOUNTER — Other Ambulatory Visit: Payer: Self-pay

## 2020-12-15 MED ORDER — AMPHETAMINE-DEXTROAMPHETAMINE 15 MG PO TABS: 15.0000 mg | ORAL_TABLET | Freq: Every day | ORAL | 0 refills | Status: DC

## 2020-12-15 MED ORDER — AMPHETAMINE-DEXTROAMPHETAMINE 30 MG PO TABS: 30.0000 mg | ORAL_TABLET | Freq: Every day | ORAL | 0 refills | Status: DC

## 2020-12-15 NOTE — Telephone Encounter (Signed)
Pt called in for refill on Adderall 15mg  and Adderall 30mg . States she is almost out and pharmacy is waiting for approval from [facility]. Appt 8/16. Ph: [phone]. Pharmacy [facility] [address] [place], [address]

## 2020-12-15 NOTE — Telephone Encounter (Signed)
Pended.

## 2021-01-08 IMAGING — CR CHEST  1 VIEW
1 series · 1 of 1 positions shown · non-contrast
Comparison: May 05, 2014

CLINICAL DATA: Pre-employment physical examination

EXAM:
CHEST  1 VIEW

[w chest pa]
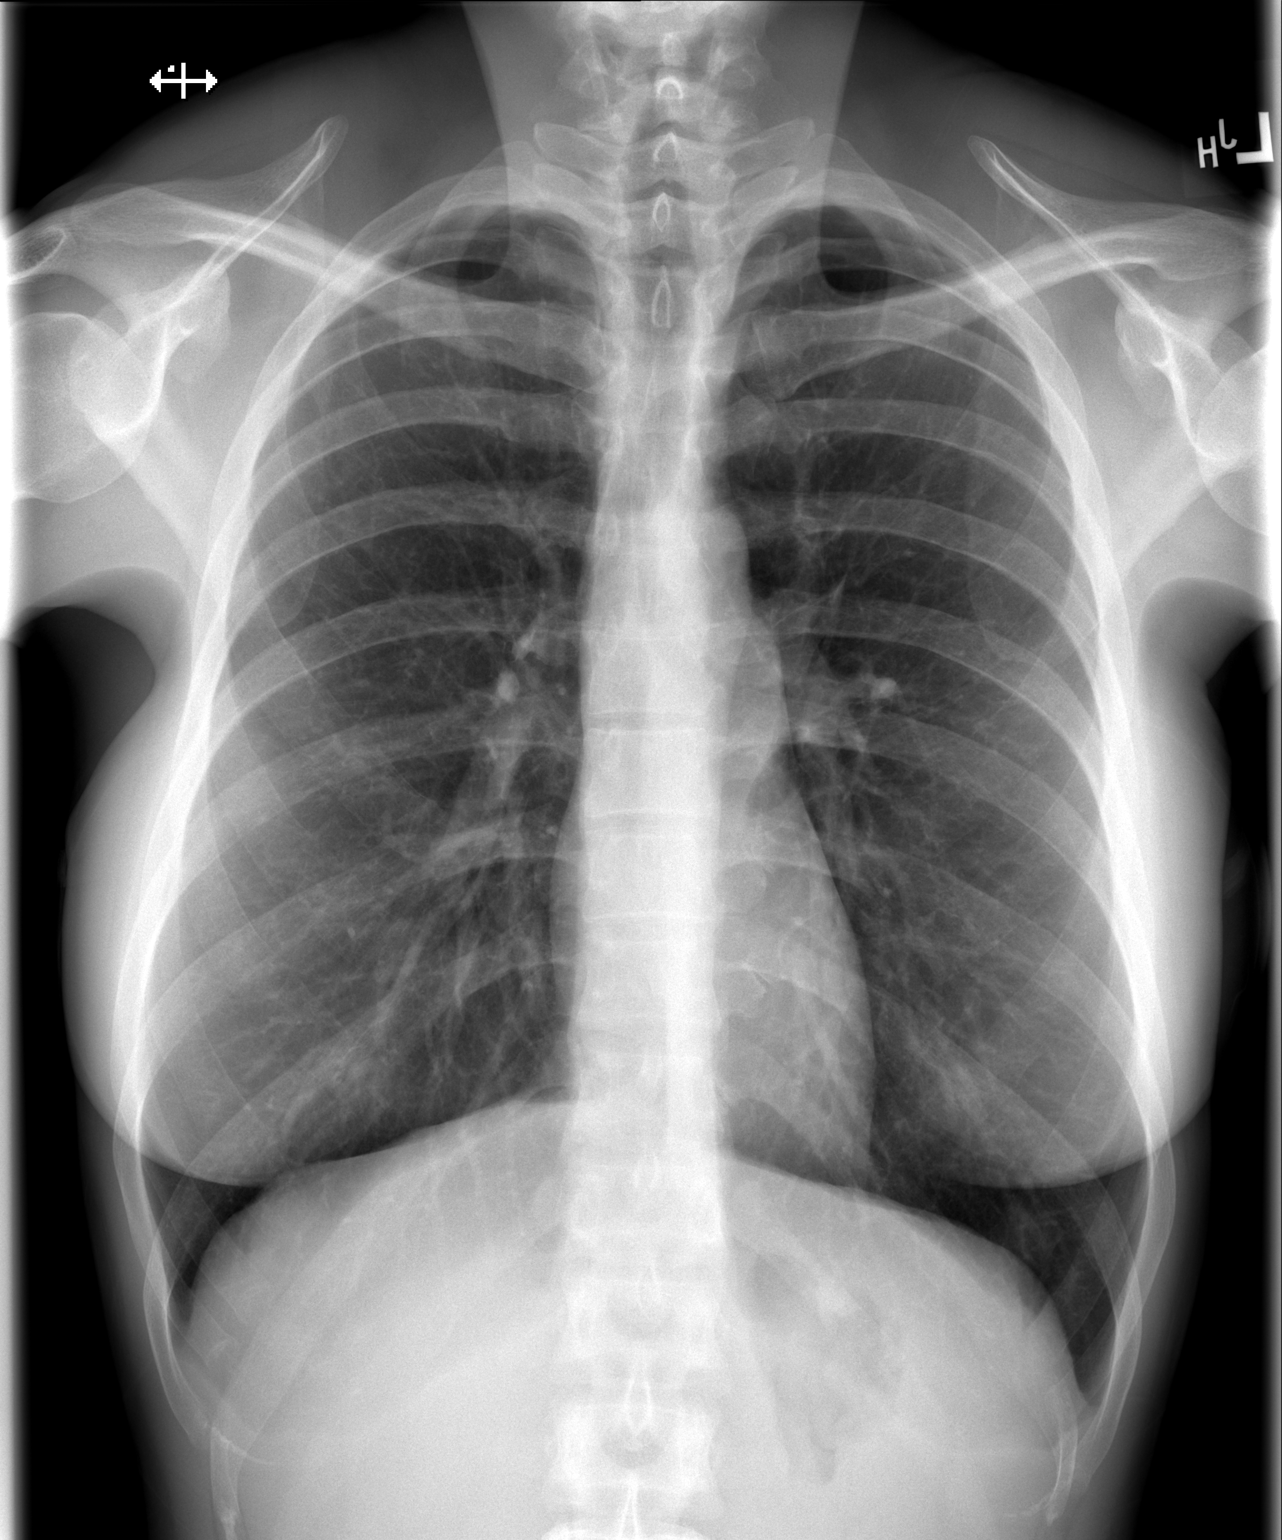

[1 of 1 positions shown; findings below may reference images not displayed]

FINDINGS: Lungs are clear. Heart size and pulmonary vascularity are normal. No
adenopathy. No bone lesions.
IMPRESSION: No abnormality noted.

## 2021-01-18 ENCOUNTER — Ambulatory Visit: Admitting: Physician Assistant

## 2021-01-21 ENCOUNTER — Telehealth: Payer: Self-pay | Admitting: Physician Assistant

## 2021-01-21 ENCOUNTER — Other Ambulatory Visit: Payer: Self-pay

## 2021-01-21 NOTE — Telephone Encounter (Signed)
Pended.

## 2021-01-21 NOTE — Telephone Encounter (Signed)
Adderall refill request 

## 2021-01-24 MED ORDER — AMPHETAMINE-DEXTROAMPHETAMINE 15 MG PO TABS: 15.0000 mg | ORAL_TABLET | Freq: Every day | ORAL | 0 refills | Status: DC

## 2021-01-24 MED ORDER — AMPHETAMINE-DEXTROAMPHETAMINE 30 MG PO TABS: 30.0000 mg | ORAL_TABLET | Freq: Every day | ORAL | 0 refills | Status: DC

## 2021-01-24 NOTE — Telephone Encounter (Signed)
Pt returned call from [place] on 8/19 about Adderall Rx would be sent to [facility] [address]. [facility] AP[address] please advise pt @ [phone]

## 2021-01-24 NOTE — Telephone Encounter (Signed)
[facility]

## 2021-02-04 ENCOUNTER — Telehealth: Admitting: Physician Assistant

## 2021-02-23 ENCOUNTER — Telehealth: Payer: Self-pay | Admitting: Physician Assistant

## 2021-02-23 NOTE — Telephone Encounter (Signed)
LVM informing pt she will have to find a pharmacy and call us back to send

## 2021-02-23 NOTE — Telephone Encounter (Signed)
[employer] lm requesting a refill on the Adderall. Fill at any pharmacy that has medication in stock and notify. # [id number]. Patient is scheduled for a phone visit on 10/17.

## 2021-02-24 ENCOUNTER — Other Ambulatory Visit: Payer: Self-pay

## 2021-02-24 MED ORDER — AMPHETAMINE-DEXTROAMPHETAMINE 15 MG PO TABS: 15.0000 mg | ORAL_TABLET | Freq: Every day | ORAL | 0 refills | Status: DC

## 2021-02-24 MED ORDER — AMPHETAMINE-DEXTROAMPHETAMINE 30 MG PO TABS: 30.0000 mg | ORAL_TABLET | Freq: Every day | ORAL | 0 refills | Status: DC

## 2021-02-24 NOTE — Telephone Encounter (Signed)
[place] called to request that the Adderall prescriptions be [facility] [address] [facility] before.  She said if they don't have it they have it auto[facility]filled at another [facility] that [facility] it.

## 2021-02-24 NOTE — Telephone Encounter (Signed)
pended

## 2021-03-21 ENCOUNTER — Ambulatory Visit (INDEPENDENT_AMBULATORY_CARE_PROVIDER_SITE_OTHER): Admitting: Physician Assistant

## 2021-03-21 ENCOUNTER — Encounter: Payer: Self-pay | Admitting: Physician Assistant

## 2021-03-21 DIAGNOSIS — F411 Generalized anxiety disorder: Secondary | ICD-10-CM | POA: Diagnosis not present

## 2021-03-21 DIAGNOSIS — F909 Attention-deficit hyperactivity disorder, unspecified type: Secondary | ICD-10-CM | POA: Diagnosis not present

## 2021-03-21 MED ORDER — AMPHETAMINE-DEXTROAMPHETAMINE 15 MG PO TABS: 15.0000 mg | ORAL_TABLET | Freq: Every day | ORAL | 0 refills | Status: DC

## 2021-03-21 MED ORDER — AMPHETAMINE-DEXTROAMPHETAMINE 30 MG PO TABS: 30.0000 mg | ORAL_TABLET | Freq: Every day | ORAL | 0 refills | Status: DC

## 2021-03-21 NOTE — Progress Notes (Signed)
[facility]  Patient ID: Katherine Keith,  MRN: [id number]  PCP: Wilfrid Lund, PA  Date of Evaluation: 03/21/2021 Time spent:25 minutes  Chief Complaint:  Chief Complaint   ADHD; Follow-up    Virtual Visit via Telehealth  I connected with patient by telephone, with their informed consent, and verified patient privacy and that I am speaking with the correct person using two identifiers.  I am private, in my office and the patient is at work.  I discussed the limitations, risks, security and privacy concerns of performing an evaluation and management service by telephone and the availability of in person appointments. I also discussed with the patient that there may be a patient responsible charge related to this service. The patient expressed understanding and agreed to proceed.   I discussed the assessment and treatment plan with the patient. The patient was provided an opportunity to ask questions and all were answered. The patient agreed with the plan and demonstrated an understanding of the instructions.   The patient was advised to call back or seek an in-person evaluation if the symptoms worsen or if the condition fails to improve as anticipated.  I provided 25 minutes of non-face-to-face time during this encounter.   HISTORY/CURRENT STATUS:  HPI For routine med check.   Doing well with Adderall. There are some days that it does not seem to work as well as others, but it is just very occasional. States that attention is good without easy distractibility.  Able to focus on things and finish tasks to completion.   Patient denies loss of interest in usual activities and is able to enjoy things.  Denies decreased energy or motivation.  Appetite has not changed.  No extreme sadness, tearfulness, or feelings of hopelessness.  Anxiety is not a huge problem.  She sleeps well.  Work is going fine.  Denies suicidal or homicidal thoughts.  Denies dizziness, syncope,  seizures, numbness, tingling, tremor, tics, unsteady gait, slurred speech, confusion. Denies muscle or joint pain, stiffness, or dystonia.  Individual Medical History/ Review of Systems: Changes? :No    Past medications for mental health diagnoses include: Unknown  Allergies: Patient has no known allergies.  Current Medications:  Current Outpatient Medications:    levonorgestrel (MIRENA) 20 MCG/24HR IUD, 1 each by Intrauterine route once., Disp: , Rfl:    Multiple Vitamin (MULTIVITAMIN) tablet, Take 1 tablet by mouth daily., Disp: , Rfl:    [START ON 05/23/2021] amphetamine-dextroamphetamine (ADDERALL) 15 MG tablet, Take 1 tablet by mouth daily at 12 noon., Disp: 30 tablet, Rfl: 0   [START ON 04/23/2021] amphetamine-dextroamphetamine (ADDERALL) 15 MG tablet, Take 1 tablet by mouth daily at 12 noon., Disp: 30 tablet, Rfl: 0   [START ON 03/25/2021] amphetamine-dextroamphetamine (ADDERALL) 15 MG tablet, Take 1 tablet by mouth daily at 12 noon., Disp: 30 tablet, Rfl: 0   [START ON 05/23/2021] amphetamine-dextroamphetamine (ADDERALL) 30 MG tablet, Take 1 tablet by mouth daily., Disp: 30 tablet, Rfl: 0   [START ON 04/23/2021] amphetamine-dextroamphetamine (ADDERALL) 30 MG tablet, Take 1 tablet by mouth daily., Disp: 30 tablet, Rfl: 0   [START ON 03/25/2021] amphetamine-dextroamphetamine (ADDERALL) 30 MG tablet, Take 1 tablet by mouth daily., Disp: 30 tablet, Rfl: 0   ferrous sulfate 325 (65 FE) MG EC tablet, Take 325 mg by mouth 3 (three) times daily with meals. (Patient not taking: Reported on 03/21/2021), Disp: , Rfl:  Medication Side Effects: none  Family Medical/ Social History: Changes?  No  MENTAL HEALTH EXAM:  There were  no vitals taken for this visit.There is no height or weight on file to calculate BMI.  General: unable to assess  Eye Contact:   Unable to assess  Speech:  Clear and Coherent  Volume:  Normal  Mood:  Euthymic  Affect:   Unable to assess  Thought Process:  Goal  Directed and Descriptions of Associations: Intact  Orientation:  Full (Time, Place, and Person)  Thought Content: Logical   Suicidal Thoughts:  No  Homicidal Thoughts:  No  Memory:  WNL  Judgement:  Good  Insight:  Good  Psychomotor Activity:   Unable to assess  Concentration:  Concentration: Good and Attention Span: Good  Recall:  Good  Fund of Knowledge: Good  Language: Good  Assets:  Desire for Improvement  ADL's:  Intact  Cognition: WNL  Prognosis:  Good    DIAGNOSES:    ICD-10-CM   1. Attention deficit hyperactivity disorder (ADHD), unspecified ADHD type  F90.9     2. Generalized anxiety disorder  F41.1        Receiving Psychotherapy: No    RECOMMENDATIONS:  PDMP was reviewed.  Last Adderall prescriptions filled 02/24/2021.  No red flags. I provided 25 minutes of non-face-to-face time during this encounter, including time spent before and after the visit in records review, medical decision making, and charting.  She is doing well so no changes need to be made. Continue Adderall 30 mg every morning. Continue Adderall 15 mg daily around noon. Return in 6 months.  Melony Overly, PA-C

## 2021-07-04 ENCOUNTER — Other Ambulatory Visit: Payer: Self-pay

## 2021-07-04 ENCOUNTER — Telehealth: Payer: Self-pay | Admitting: Physician Assistant

## 2021-07-04 MED ORDER — AMPHETAMINE-DEXTROAMPHETAMINE 15 MG PO TABS: 15.0000 mg | ORAL_TABLET | Freq: Every day | ORAL | 0 refills | Status: DC

## 2021-07-04 MED ORDER — AMPHETAMINE-DEXTROAMPHETAMINE 30 MG PO TABS: 30.0000 mg | ORAL_TABLET | Freq: Every day | ORAL | 0 refills | Status: DC

## 2021-07-04 NOTE — Telephone Encounter (Signed)
Pended.

## 2021-07-04 NOTE — Telephone Encounter (Signed)
Pt LVM requesting Rx for Adderall 15 mg & Adderall  30 mg @ [facility]. F/U due April.Contact # [phone]

## 2021-08-08 ENCOUNTER — Other Ambulatory Visit: Payer: Self-pay

## 2021-08-08 ENCOUNTER — Telehealth: Payer: Self-pay | Admitting: Physician Assistant

## 2021-08-08 MED ORDER — AMPHETAMINE-DEXTROAMPHETAMINE 15 MG PO TABS: 15.0000 mg | ORAL_TABLET | Freq: Every day | ORAL | 0 refills | Status: DC

## 2021-08-08 NOTE — Telephone Encounter (Signed)
Pended.

## 2021-08-08 NOTE — Telephone Encounter (Signed)
LM for Kiristen to call for an appt. She is out of Adderall 15 mg. Pharmacy is: ? ?[id number] - [place], [place] - [address] AT [facility] OF [address] ? ?Phone:  [phone]  ?Fax:  [phone]  ? ? ? ? ? ?

## 2021-09-06 ENCOUNTER — Other Ambulatory Visit: Payer: Self-pay | Admitting: Family Medicine

## 2021-09-06 ENCOUNTER — Ambulatory Visit
Admission: RE | Admit: 2021-09-06 | Discharge: 2021-09-06 | Disposition: A | Discharge status: Home / Self Care | Source: Ambulatory Visit | Attending: Family Medicine | Admitting: Family Medicine

## 2021-09-06 DIAGNOSIS — M545 Low back pain, unspecified: Secondary | ICD-10-CM

## 2021-09-08 ENCOUNTER — Other Ambulatory Visit: Payer: Self-pay

## 2021-09-08 ENCOUNTER — Telehealth: Payer: Self-pay | Admitting: Physician Assistant

## 2021-09-08 ENCOUNTER — Other Ambulatory Visit: Payer: Self-pay | Admitting: Physician Assistant

## 2021-09-08 MED ORDER — AMPHETAMINE-DEXTROAMPHETAMINE 30 MG PO TABS: 30.0000 mg | ORAL_TABLET | Freq: Every day | ORAL | 0 refills | Status: DC

## 2021-09-08 MED ORDER — AMPHETAMINE-DEXTROAMPHETAMINE 15 MG PO TABS: 15.0000 mg | ORAL_TABLET | Freq: Every day | ORAL | 0 refills | Status: DC

## 2021-09-08 NOTE — Telephone Encounter (Signed)
She has an Rx for both, that could have been filled 3/28. Has she checked w/ her pharmacy? [facility] [address] ?Thanks

## 2021-09-08 NOTE — Telephone Encounter (Signed)
Last visit was 03/21/21. Left a message to call back for a f/u appointment. Katherine Keith is requesting a refill on her Adderall 15 and 30 mg pills. She checked with the pharmacy and they do have both of these mg in stock. Please call to: ? ?[facility] #[id number] - [place], [place] - [address] AT [facility] OF [address] ? ?Phone:  [phone]  ?Fax:  [phone]  ? ? ? ? ? ?

## 2021-09-08 NOTE — Telephone Encounter (Signed)
Pended.

## 2021-10-10 ENCOUNTER — Telehealth: Payer: Self-pay | Admitting: Physician Assistant

## 2021-10-10 ENCOUNTER — Other Ambulatory Visit: Payer: Self-pay | Admitting: Physician Assistant

## 2021-10-10 MED ORDER — AMPHETAMINE-DEXTROAMPHETAMINE 30 MG PO TABS: 30.0000 mg | ORAL_TABLET | Freq: Every day | ORAL | 0 refills | Status: DC

## 2021-10-10 MED ORDER — AMPHETAMINE-DEXTROAMPHETAMINE 15 MG PO TABS: 15.0000 mg | ORAL_TABLET | Freq: Every day | ORAL | 0 refills | Status: DC

## 2021-10-10 NOTE — Telephone Encounter (Signed)
Pt requesting Rx for Adderall 30 mg and 15 mg @ [facility]. Have in stock. Apt 5/18 ?

## 2021-10-10 NOTE — Telephone Encounter (Signed)
Prescriptions were sent

## 2021-10-20 ENCOUNTER — Encounter: Payer: Self-pay | Admitting: Physician Assistant

## 2021-10-20 ENCOUNTER — Ambulatory Visit (INDEPENDENT_AMBULATORY_CARE_PROVIDER_SITE_OTHER): Admitting: Physician Assistant

## 2021-10-20 DIAGNOSIS — F909 Attention-deficit hyperactivity disorder, unspecified type: Secondary | ICD-10-CM | POA: Diagnosis not present

## 2021-10-20 NOTE — Progress Notes (Signed)
[facility]  Patient ID: Katherine Keith,  MRN: [id number]  PCP: Wilfrid Lund, PA  Date of Evaluation: 10/20/2021 Time spent:15 minutes  Chief Complaint:  Chief Complaint   ADHD; Follow-up    Virtual Visit via Telehealth  I connected with patient by telephone, with their informed consent, and verified patient privacy and that I am speaking with the correct person using two identifiers.  I am private, in my office and the patient is at work.  I discussed the limitations, risks, security and privacy concerns of performing an evaluation and management service by telephone and the availability of in person appointments. I also discussed with the patient that there may be a patient responsible charge related to this service. The patient expressed understanding and agreed to proceed.   I discussed the assessment and treatment plan with the patient. The patient was provided an opportunity to ask questions and all were answered. The patient agreed with the plan and demonstrated an understanding of the instructions.   The patient was advised to call back or seek an in-person evaluation if the symptoms worsen or if the condition fails to improve as anticipated.  I provided 15 minutes of non-face-to-face time during this encounter.   HISTORY/CURRENT STATUS:  HPI For routine med check.   Doing well with Adderall.  Has had a little bit of difficulty finding it a few times due to the shortage.  But has not had to go without it, as she finds it at 1 of 2 pharmacies she uses.  It is still effective.   States that attention is good without easy distractibility.  Able to focus on things and finish tasks to completion.   Patient denies loss of interest in usual activities and is able to enjoy things.  Denies decreased energy or motivation.  Appetite has not changed.  No extreme sadness, tearfulness, or feelings of hopelessness.  Anxiety is not a huge problem.  She sleeps well.  Work is  going fine.  Denies suicidal or homicidal thoughts.  Denies dizziness, syncope, seizures, numbness, tingling, tremor, tics, unsteady gait, slurred speech, confusion. Denies muscle or joint pain, stiffness, or dystonia.  Individual Medical History/ Review of Systems: Changes? :No    Past medications for mental health diagnoses include: Unknown  Allergies: Patient has no known allergies.  Current Medications:  Current Outpatient Medications:    amphetamine-dextroamphetamine (ADDERALL) 15 MG tablet, Take 1 tablet by mouth daily at 12 noon., Disp: 30 tablet, Rfl: 0   amphetamine-dextroamphetamine (ADDERALL) 15 MG tablet, Take 1 tablet by mouth daily at 12 noon., Disp: 30 tablet, Rfl: 0   amphetamine-dextroamphetamine (ADDERALL) 15 MG tablet, Take 1 tablet by mouth daily at 12 noon., Disp: 30 tablet, Rfl: 0   amphetamine-dextroamphetamine (ADDERALL) 30 MG tablet, Take 1 tablet by mouth daily., Disp: 30 tablet, Rfl: 0   amphetamine-dextroamphetamine (ADDERALL) 30 MG tablet, Take 1 tablet by mouth daily., Disp: 30 tablet, Rfl: 0   amphetamine-dextroamphetamine (ADDERALL) 30 MG tablet, Take 1 tablet by mouth daily., Disp: 30 tablet, Rfl: 0   levonorgestrel (MIRENA) 20 MCG/24HR IUD, 1 each by Intrauterine route once., Disp: , Rfl:    Multiple Vitamin (MULTIVITAMIN) tablet, Take 1 tablet by mouth daily., Disp: , Rfl:  Medication Side Effects: none  Family Medical/ Social History: Changes?  No  MENTAL HEALTH EXAM:  There were no vitals taken for this visit.There is no height or weight on file to calculate BMI.  General: unable to assess  Eye Contact:  Unable to assess  Speech:  Clear and Coherent and Normal Rate  Volume:  Normal  Mood:  Euthymic  Affect:   Unable to assess  Thought Process:  Goal Directed and Descriptions of Associations: Intact  Orientation:  Full (Time, Place, and Person)  Thought Content: Logical   Suicidal Thoughts:  No  Homicidal Thoughts:  No  Memory:  WNL   Judgement:  Good  Insight:  Good  Psychomotor Activity:   Unable to assess  Concentration:  Concentration: Good and Attention Span: Good  Recall:  Good  Fund of Knowledge: Good  Language: Good  Assets:  Desire for Improvement  ADL's:  Intact  Cognition: WNL  Prognosis:  Good    DIAGNOSES:    ICD-10-CM   1. Attention deficit hyperactivity disorder (ADHD), unspecified ADHD type  F90.9       Receiving Psychotherapy: No    RECOMMENDATIONS:  PDMP was reviewed.  Last Adderall prescriptions filled 10/10/2021.  I provided 15 minutes of non-face-to-face time during this encounter, including time spent before and after the visit in records review, medical decision making, counseling pertinent to today's visit, and charting.  She is doing well so no changes need to be made.  Continue Adderall 30 mg every morning. Continue Adderall 15 mg daily around noon. Return in 6 months.  Melony Overly, PA-C

## 2021-11-14 ENCOUNTER — Other Ambulatory Visit: Payer: Self-pay

## 2021-11-14 ENCOUNTER — Telehealth: Payer: Self-pay | Admitting: Physician Assistant

## 2021-11-14 MED ORDER — AMPHETAMINE-DEXTROAMPHETAMINE 30 MG PO TABS: 30.0000 mg | ORAL_TABLET | Freq: Every day | ORAL | 0 refills | Status: DC

## 2021-11-14 MED ORDER — AMPHETAMINE-DEXTROAMPHETAMINE 15 MG PO TABS: 15.0000 mg | ORAL_TABLET | Freq: Every day | ORAL | 0 refills | Status: DC

## 2021-11-14 NOTE — Telephone Encounter (Signed)
Pended.

## 2021-11-14 NOTE — Telephone Encounter (Signed)
Pt lvm at 1:59 pm asking for a refill on her adderall 15 mg and adderall 30 mg. [facility] [address] have both in stock

## 2021-12-19 ENCOUNTER — Telehealth: Payer: Self-pay | Admitting: Physician Assistant

## 2021-12-19 ENCOUNTER — Other Ambulatory Visit: Payer: Self-pay

## 2021-12-19 MED ORDER — AMPHETAMINE-DEXTROAMPHETAMINE 30 MG PO TABS: 30.0000 mg | ORAL_TABLET | Freq: Every day | ORAL | 0 refills | Status: DC

## 2021-12-19 MED ORDER — AMPHETAMINE-DEXTROAMPHETAMINE 15 MG PO TABS
15.0000 mg | ORAL_TABLET | Freq: Every day | ORAL | 0 refills | Status: DC
Start: 2021-12-19 — End: 2022-02-27

## 2021-12-19 NOTE — Telephone Encounter (Signed)
Patient lvm requesting refill for Adderall 30mg  and Adderall 15mg . States she lvm  last regarding prescription running low. TH didn't authorize because her pharmacy has been out of stock, and she has checked pharmacy and thye have it in stock. She would like prescription sent ASAP because she is currently out. Ph: [phone] Pharmacy [facility] [address] and 

## 2021-12-19 NOTE — Telephone Encounter (Signed)
Pended.

## 2022-01-24 ENCOUNTER — Other Ambulatory Visit: Payer: Self-pay

## 2022-01-24 ENCOUNTER — Telehealth: Payer: Self-pay | Admitting: Physician Assistant

## 2022-01-24 MED ORDER — AMPHETAMINE-DEXTROAMPHETAMINE 30 MG PO TABS
30.0000 mg | ORAL_TABLET | Freq: Every day | ORAL | 0 refills | Status: DC
Start: 2022-01-24 — End: 2022-04-20

## 2022-01-24 MED ORDER — AMPHETAMINE-DEXTROAMPHETAMINE 15 MG PO TABS
15.0000 mg | ORAL_TABLET | Freq: Every day | ORAL | 0 refills | Status: DC
Start: 2022-01-24 — End: 2022-03-30

## 2022-01-24 NOTE — Telephone Encounter (Signed)
Katherine Keith lvm requesting refills on the Adderall 30 mg and 15 mg.  Fill at [facility] on [address] location  Patient last seen on 10/20/21. No follow up shceduled

## 2022-01-24 NOTE — Telephone Encounter (Signed)
Pended.

## 2022-02-24 ENCOUNTER — Other Ambulatory Visit: Payer: Self-pay

## 2022-02-24 ENCOUNTER — Telehealth: Payer: Self-pay | Admitting: Physician Assistant

## 2022-02-24 MED ORDER — AMPHETAMINE-DEXTROAMPHETAMINE 15 MG PO TABS
15.0000 mg | ORAL_TABLET | Freq: Every day | ORAL | 0 refills | Status: DC
Start: 2022-02-24 — End: 2022-04-20

## 2022-02-24 MED ORDER — AMPHETAMINE-DEXTROAMPHETAMINE 30 MG PO TABS: 30.0000 mg | ORAL_TABLET | Freq: Every day | ORAL | 0 refills | Status: DC

## 2022-02-24 NOTE — Telephone Encounter (Signed)
No pharmacy provided in message. LVM to RC.

## 2022-02-24 NOTE — Telephone Encounter (Signed)
Patient lvm requesting refills on the Adderall 30 mg and 15 mg. Patient was last seen on 5/18. No follow up scheduled.  Contact information [phone]

## 2022-02-27 ENCOUNTER — Other Ambulatory Visit: Payer: Self-pay

## 2022-02-27 MED ORDER — AMPHETAMINE-DEXTROAMPHETAMINE 15 MG PO TABS
15.0000 mg | ORAL_TABLET | Freq: Every day | ORAL | 0 refills | Status: DC
Start: 2022-02-27 — End: 2022-04-20

## 2022-02-27 NOTE — Telephone Encounter (Signed)
Cancelled and pended  

## 2022-02-27 NOTE — Telephone Encounter (Signed)
Pt called back at 10:37a.  [facility] is available at [facility] [address].  Generic Adderall

## 2022-02-27 NOTE — Telephone Encounter (Signed)
Pt lvm 9/24@ 12:43p.  Her pharmacy didn't have the Adderall 15mg  and didn't tell her.  She said that generic Adderall is available at [facility].  I LVM to find out if it's [address].  Will update message when she calls back in with info.  No upcoming appt scheduled

## 2022-03-30 ENCOUNTER — Other Ambulatory Visit: Payer: Self-pay

## 2022-03-30 ENCOUNTER — Telehealth: Payer: Self-pay | Admitting: Physician Assistant

## 2022-03-30 MED ORDER — AMPHETAMINE-DEXTROAMPHETAMINE 15 MG PO TABS: 15.0000 mg | ORAL_TABLET | Freq: Every day | ORAL | 0 refills | Status: DC

## 2022-03-30 MED ORDER — AMPHETAMINE-DEXTROAMPHETAMINE 30 MG PO TABS
30.0000 mg | ORAL_TABLET | Freq: Every day | ORAL | 0 refills | Status: DC
Start: 2022-03-30 — End: 2022-04-20

## 2022-03-30 NOTE — Telephone Encounter (Signed)
Pended.

## 2022-03-30 NOTE — Telephone Encounter (Signed)
Pt requesting Rx for generic adderall 15 mg & 30 mg tablets. [facility] [address]. Apt 11/16

## 2022-04-20 ENCOUNTER — Ambulatory Visit (INDEPENDENT_AMBULATORY_CARE_PROVIDER_SITE_OTHER): Admitting: Physician Assistant

## 2022-04-20 ENCOUNTER — Encounter: Payer: Self-pay | Admitting: Physician Assistant

## 2022-04-20 DIAGNOSIS — F411 Generalized anxiety disorder: Secondary | ICD-10-CM | POA: Diagnosis not present

## 2022-04-20 DIAGNOSIS — F909 Attention-deficit hyperactivity disorder, unspecified type: Secondary | ICD-10-CM | POA: Diagnosis not present

## 2022-04-20 MED ORDER — AMPHETAMINE-DEXTROAMPHETAMINE 30 MG PO TABS: 30.0000 mg | ORAL_TABLET | Freq: Every day | ORAL | 0 refills | Status: DC

## 2022-04-20 MED ORDER — AMPHETAMINE-DEXTROAMPHETAMINE 15 MG PO TABS: 15.0000 mg | ORAL_TABLET | Freq: Every day | ORAL | 0 refills | Status: DC

## 2022-04-20 MED ORDER — AMPHETAMINE-DEXTROAMPHETAMINE 30 MG PO TABS
30.0000 mg | ORAL_TABLET | Freq: Every day | ORAL | 0 refills | Status: DC
Start: 2022-05-27 — End: 2022-07-26

## 2022-04-20 NOTE — Progress Notes (Signed)
[facility]  Patient ID: Katherine Keith,  MRN: [id number]  PCP: Wilfrid Lund, PA  Date of Evaluation: 04/20/2022 Time spent:25 minutes  Chief Complaint:  Chief Complaint   ADHD; Follow-up    Virtual Visit via Telehealth  I connected with patient by telephone, with their informed consent, and verified patient privacy and that I am speaking with the correct person using two identifiers.  I am private, in my office and the patient is at work.  I discussed the limitations, risks, security and privacy concerns of performing an evaluation and management service by telephone and the availability of in person appointments. I also discussed with the patient that there may be a patient responsible charge related to this service. The patient expressed understanding and agreed to proceed.   I discussed the assessment and treatment plan with the patient. The patient was provided an opportunity to ask questions and all were answered. The patient agreed with the plan and demonstrated an understanding of the instructions.   The patient was advised to call back or seek an in-person evaluation if the symptoms worsen or if the condition fails to improve as anticipated.  I provided 25 minutes of non-face-to-face time during this encounter.  HISTORY/CURRENT STATUS:  HPI For routine med check.   She is doing well.  Feels that the Adderall is still working at the present dose.  She is able to focus and get things done in a timely manner.  Work is going fine.  She is busy there and at home which she likes.  Patient is able to enjoy things.  Energy and motivation are good.  No extreme sadness, tearfulness, or feelings of hopelessness.  Sleeps well.  ADLs and personal hygiene are normal.   Appetite has not changed.  Weight is stable.  Not isolating.  Occasionally she gets anxious but not commonly.  When she does it is for a specific reason.  No panic attacks.  Denies suicidal or homicidal  thoughts.  Denies dizziness, syncope, seizures, numbness, tingling, tremor, tics, unsteady gait, slurred speech, confusion. Denies muscle or joint pain, stiffness, or dystonia.  Individual Medical History/ Review of Systems: Changes? :No    Past medications for mental health diagnoses include: Unknown  Allergies: Patient has no known allergies.  Current Medications:  Current Outpatient Medications:    levonorgestrel (MIRENA) 20 MCG/24HR IUD, 1 each by Intrauterine route once., Disp: , Rfl:    Multiple Vitamin (MULTIVITAMIN) tablet, Take 1 tablet by mouth daily., Disp: , Rfl:    [START ON 06/27/2022] amphetamine-dextroamphetamine (ADDERALL) 15 MG tablet, Take 1 tablet by mouth daily at 12 noon., Disp: 30 tablet, Rfl: 0   [START ON 04/29/2022] amphetamine-dextroamphetamine (ADDERALL) 15 MG tablet, Take 1 tablet by mouth daily at 12 noon., Disp: 30 tablet, Rfl: 0   [START ON 05/27/2022] amphetamine-dextroamphetamine (ADDERALL) 15 MG tablet, Take 1 tablet by mouth daily at 12 noon., Disp: 30 tablet, Rfl: 0   [START ON 05/27/2022] amphetamine-dextroamphetamine (ADDERALL) 30 MG tablet, Take 1 tablet by mouth daily., Disp: 30 tablet, Rfl: 0   [START ON 06/27/2022] amphetamine-dextroamphetamine (ADDERALL) 30 MG tablet, Take 1 tablet by mouth daily., Disp: 30 tablet, Rfl: 0   [START ON 04/29/2022] amphetamine-dextroamphetamine (ADDERALL) 30 MG tablet, Take 1 tablet by mouth daily., Disp: 30 tablet, Rfl: 0 Medication Side Effects: none  Family Medical/ Social History: Changes?  No  MENTAL HEALTH EXAM:  There were no vitals taken for this visit.There is no height or weight on file to calculate  BMI.  General: unable to assess  Eye Contact:   Unable to assess  Speech:  Clear and Coherent and Normal Rate  Volume:  Normal  Mood:  Euthymic  Affect:   Unable to assess  Thought Process:  Goal Directed and Descriptions of Associations: Intact  Orientation:  Full (Time, Place, and Person)  Thought  Content: Logical   Suicidal Thoughts:  No  Homicidal Thoughts:  No  Memory:  WNL  Judgement:  Good  Insight:  Good  Psychomotor Activity:   Unable to assess  Concentration:  Concentration: Good and Attention Span: Good  Recall:  Good  Fund of Knowledge: Good  Language: Good  Assets:  Desire for Improvement Financial Resources/Insurance Housing Transportation Vocational/Educational  ADL's:  Intact  Cognition: WNL  Prognosis:  Good   DIAGNOSES:    ICD-10-CM   1. Attention deficit hyperactivity disorder (ADHD), unspecified ADHD type  F90.9     2. Generalized anxiety disorder  F41.1      Receiving Psychotherapy: No   RECOMMENDATIONS:  PDMP was reviewed.  Last Adderall prescriptions filled 03/30/2022. I provided 25 minutes of non-face-to-face time during this encounter, including time spent before and after the visit in records review, medical decision making, counseling pertinent to today's visit, and charting.   Doing well so no changes needed  Continue Adderall 30 mg every morning. Continue Adderall 15 mg daily around noon. Return in 6 months.  Melony Overly, PA-C

## 2022-07-26 ENCOUNTER — Other Ambulatory Visit: Payer: Self-pay

## 2022-07-26 ENCOUNTER — Telehealth: Payer: Self-pay | Admitting: Physician Assistant

## 2022-07-26 MED ORDER — AMPHETAMINE-DEXTROAMPHETAMINE 30 MG PO TABS: 30.0000 mg | ORAL_TABLET | Freq: Every day | ORAL | 0 refills | Status: DC

## 2022-07-26 MED ORDER — AMPHETAMINE-DEXTROAMPHETAMINE 15 MG PO TABS: 15.0000 mg | ORAL_TABLET | Freq: Every day | ORAL | 0 refills | Status: DC

## 2022-07-26 NOTE — Telephone Encounter (Signed)
Pended.

## 2022-07-26 NOTE — Telephone Encounter (Signed)
Pt lvm that she needs refills on her adderall 15 mg and adderall 30 mg. [facility]acy [facility] [address]

## 2022-08-23 ENCOUNTER — Telehealth: Payer: Self-pay | Admitting: Physician Assistant

## 2022-08-23 NOTE — Telephone Encounter (Signed)
Pt requesting Rx generic adderall 15 mg and 30 mg to [facility] [address]. 6 mo f/u due May 2024

## 2022-08-24 ENCOUNTER — Other Ambulatory Visit: Payer: Self-pay | Admitting: Physician Assistant

## 2022-08-24 MED ORDER — AMPHETAMINE-DEXTROAMPHETAMINE 30 MG PO TABS: 30.0000 mg | ORAL_TABLET | Freq: Every day | ORAL | 0 refills | Status: DC

## 2022-08-24 MED ORDER — AMPHETAMINE-DEXTROAMPHETAMINE 15 MG PO TABS: 15.0000 mg | ORAL_TABLET | Freq: Every day | ORAL | 0 refills | Status: DC

## 2022-08-24 NOTE — Telephone Encounter (Signed)
sent 

## 2022-11-30 ENCOUNTER — Other Ambulatory Visit: Payer: Self-pay

## 2022-11-30 ENCOUNTER — Telehealth: Payer: Self-pay | Admitting: Physician Assistant

## 2022-11-30 NOTE — Telephone Encounter (Signed)
Pt called requesting Rx for generic adderall 15 mg & 30 mg to [facility] [address]. 6 mos f/u due. Pt called and LVM to RTC for apt.

## 2022-12-01 MED ORDER — AMPHETAMINE-DEXTROAMPHETAMINE 30 MG PO TABS: 30.0000 mg | ORAL_TABLET | Freq: Every day | ORAL | 0 refills | Status: DC

## 2022-12-01 MED ORDER — AMPHETAMINE-DEXTROAMPHETAMINE 15 MG PO TABS: 15.0000 mg | ORAL_TABLET | Freq: Every day | ORAL | 0 refills | Status: DC

## 2022-12-01 NOTE — Telephone Encounter (Signed)
Patient returned call to office regarding prescription refill for Adderall 15mg  and 30mg . Ph: [phone] Appt 7/23 Pharmacy [facility] [address] [place]

## 2022-12-01 NOTE — Telephone Encounter (Signed)
Pended.

## 2022-12-26 ENCOUNTER — Encounter: Payer: Self-pay | Admitting: Physician Assistant

## 2022-12-26 ENCOUNTER — Telehealth (INDEPENDENT_AMBULATORY_CARE_PROVIDER_SITE_OTHER): Admitting: Physician Assistant

## 2022-12-26 DIAGNOSIS — F909 Attention-deficit hyperactivity disorder, unspecified type: Secondary | ICD-10-CM | POA: Diagnosis not present

## 2022-12-26 DIAGNOSIS — F411 Generalized anxiety disorder: Secondary | ICD-10-CM

## 2022-12-26 MED ORDER — AMPHETAMINE-DEXTROAMPHETAMINE 30 MG PO TABS: 30.0000 mg | ORAL_TABLET | Freq: Every day | ORAL | 0 refills | Status: DC

## 2022-12-26 MED ORDER — AMPHETAMINE-DEXTROAMPHETAMINE 15 MG PO TABS: 15.0000 mg | ORAL_TABLET | Freq: Every day | ORAL | 0 refills | Status: DC

## 2022-12-26 NOTE — Progress Notes (Signed)
[facility]  Patient ID: Katherine Keith,  MRN: [id number]  PCP: Wilfrid Lund, PA  Date of Evaluation: 12/26/2022 Time spent:20 minutes  Chief Complaint:  Chief Complaint   ADHD; Follow-up    Virtual Visit via Telehealth  I connected with patient by video with their informed consent, and verified patient privacy and that I am speaking with the correct person using two identifiers.  I am private, in my office and the patient is at work.  I discussed the limitations, risks, security and privacy concerns of performing an evaluation and management service by video and the availability of in person appointments. I also discussed with the patient that there may be a patient responsible charge related to this service. The patient expressed understanding and agreed to proceed.   I discussed the assessment and treatment plan with the patient. The patient was provided an opportunity to ask questions and all were answered. The patient agreed with the plan and demonstrated an understanding of the instructions.   The patient was advised to call back or seek an in-person evaluation if the symptoms worsen or if the condition fails to improve as anticipated.  I provided 20 minutes of non-face-to-face time during this encounter.  HISTORY/CURRENT STATUS:  HPI For routine med check.   Katherine Keith is doing well.  The Adderall is still working as it should. States that attention is good without easy distractibility.  Able to focus on things and finish tasks to completion.   Patient is able to enjoy things.  Energy and motivation are good.  Work is going well but very busy right now.  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  No extreme sadness, tearfulness, or feelings of hopelessness.  Sleeps well most of the time. ADLs and personal hygiene are normal. Appetite has not changed.  Weight is stable.   No complaints  of anxiety.  Denies suicidal or homicidal thoughts.  Patient denies increased energy with decreased need for sleep, increased talkativeness, racing thoughts, impulsivity or risky behaviors, increased spending, increased libido, grandiosity, increased irritability or anger, paranoia, or hallucinations.  Denies dizziness, syncope, seizures, numbness, tingling, tremor, tics, unsteady gait, slurred speech, confusion. Denies muscle or joint pain, stiffness, or dystonia.  Individual Medical History/ Review of Systems: Changes? :No    Past medications for mental health diagnoses include: Unknown  Allergies: Patient has no known allergies.  Current Medications:  Current Outpatient Medications:    levonorgestrel (MIRENA) 20 MCG/24HR IUD, 1 each by Intrauterine route once., Disp: , Rfl:    Multiple Vitamin (MULTIVITAMIN) tablet, Take 1 tablet by mouth daily., Disp: , Rfl:    [START ON 12/30/2022] amphetamine-dextroamphetamine (ADDERALL) 15 MG tablet, Take 1 tablet by mouth daily at 12 noon., Disp: 30 tablet, Rfl: 0   [START ON 01/29/2023] amphetamine-dextroamphetamine (ADDERALL) 15 MG tablet, Take 1 tablet by mouth daily at 12 noon., Disp: 30 tablet, Rfl: 0   [START ON 02/28/2023] amphetamine-dextroamphetamine (ADDERALL) 15 MG tablet, Take 1 tablet by mouth daily at 12 noon., Disp: 30 tablet, Rfl: 0   [START ON 01/29/2023] amphetamine-dextroamphetamine (ADDERALL) 30 MG tablet, Take 1 tablet by mouth daily., Disp: 30 tablet, Rfl: 0   [START ON 02/28/2023] amphetamine-dextroamphetamine (ADDERALL) 30 MG tablet, Take 1 tablet by mouth daily., Disp: 30 tablet, Rfl: 0   [START ON 12/30/2022] amphetamine-dextroamphetamine (ADDERALL) 30 MG tablet, Take 1 tablet by mouth daily., Disp: 30 tablet, Rfl: 0 Medication Side  Effects: none  Family Medical/ Social History: Changes?  No  MENTAL HEALTH EXAM:  There were no vitals taken for this visit.There is no height or weight on file to calculate BMI.  General: casual,  well groomed  Eye Contact:  Good  Speech:  Clear and Coherent and Normal Rate  Volume:  Normal  Mood:  Euthymic  Affect:  Congruent  Thought Process:  Goal Directed and Descriptions of Associations: Circumstantial  Orientation:  Full (Time, Place, and Person)  Thought Content: Logical   Suicidal Thoughts:  No  Homicidal Thoughts:  No  Memory:  WNL  Judgement:  Good  Insight:  Good  Psychomotor Activity:   Unable to assess  Concentration:  Concentration: Good and Attention Span: Good  Recall:  Good  Fund of Knowledge: Good  Language: Good  Assets:  Desire for Improvement Financial Resources/Insurance Housing Transportation Vocational/Educational  ADL's:  Intact  Cognition: WNL  Prognosis:  Good   DIAGNOSES:    ICD-10-CM   1. Attention deficit hyperactivity disorder (ADHD), unspecified ADHD type  F90.9     2. Generalized anxiety disorder  F41.1       Receiving Psychotherapy: No   RECOMMENDATIONS:  PDMP was reviewed.  Last Adderall prescriptions filled 12/01/2022. I provided  20 minutes of non-face-to-face time during this encounter, including time spent before and after the visit in records review, medical decision making, counseling pertinent to today's visit, and charting.   She is doing well so no changes need to be made.  Continue Adderall 30 mg every morning. Continue Adderall 15 mg daily around noon. Return in 6 months.  Melony Overly, PA-C

## 2023-04-11 ENCOUNTER — Other Ambulatory Visit: Payer: Self-pay

## 2023-04-11 ENCOUNTER — Telehealth: Payer: Self-pay | Admitting: Physician Assistant

## 2023-04-11 MED ORDER — AMPHETAMINE-DEXTROAMPHETAMINE 30 MG PO TABS: 30.0000 mg | ORAL_TABLET | Freq: Every day | ORAL | 0 refills | Status: DC

## 2023-04-11 MED ORDER — AMPHETAMINE-DEXTROAMPHETAMINE 15 MG PO TABS: 15.0000 mg | ORAL_TABLET | Freq: Every day | ORAL | 0 refills | Status: DC

## 2023-04-11 NOTE — Telephone Encounter (Signed)
pended

## 2023-04-11 NOTE — Telephone Encounter (Signed)
Pt called to ask for RF on both doses of Adderall, 15mg  and 30mg . I called her and LM to schedule a follow up,but not due back until Jan. Please send in RF to : [id number] - [place], [place] - [address] AT [address] OF [facility] [address]

## 2023-05-15 ENCOUNTER — Other Ambulatory Visit: Payer: Self-pay

## 2023-05-15 ENCOUNTER — Telehealth: Payer: Self-pay | Admitting: Physician Assistant

## 2023-05-15 MED ORDER — AMPHETAMINE-DEXTROAMPHETAMINE 30 MG PO TABS: 30.0000 mg | ORAL_TABLET | Freq: Every day | ORAL | 0 refills | Status: DC

## 2023-05-15 MED ORDER — AMPHETAMINE-DEXTROAMPHETAMINE 15 MG PO TABS: 15.0000 mg | ORAL_TABLET | Freq: Every day | ORAL | 0 refills | Status: DC

## 2023-05-15 NOTE — Telephone Encounter (Signed)
Pt lvm that she needs her adderall 15 mg and her adderall 30 mg refilled. [facility] is [facility] on [address]

## 2023-05-15 NOTE — Telephone Encounter (Signed)
Pended Adderall 15 and 30 [facility] to [facility] [address]

## 2023-06-22 ENCOUNTER — Other Ambulatory Visit: Payer: Self-pay | Admitting: Physician Assistant

## 2023-06-22 NOTE — Telephone Encounter (Signed)
Pended adderall 15 mg and 30 mg to [facility].

## 2023-06-22 NOTE — Telephone Encounter (Signed)
Pt lvm that she needs refills on her adderall 30 mg and adderall 15 mg. Pharmacy is [facility] at [address]

## 2023-06-25 MED ORDER — AMPHETAMINE-DEXTROAMPHETAMINE 30 MG PO TABS: 30.0000 mg | ORAL_TABLET | Freq: Every day | ORAL | 0 refills | Status: DC

## 2023-06-25 MED ORDER — AMPHETAMINE-DEXTROAMPHETAMINE 15 MG PO TABS: 15.0000 mg | ORAL_TABLET | Freq: Every day | ORAL | 0 refills | Status: DC

## 2023-07-31 ENCOUNTER — Encounter: Payer: Self-pay | Admitting: Physician Assistant

## 2023-07-31 ENCOUNTER — Ambulatory Visit (INDEPENDENT_AMBULATORY_CARE_PROVIDER_SITE_OTHER): Admitting: Physician Assistant

## 2023-07-31 DIAGNOSIS — F909 Attention-deficit hyperactivity disorder, unspecified type: Secondary | ICD-10-CM | POA: Diagnosis not present

## 2023-07-31 DIAGNOSIS — F411 Generalized anxiety disorder: Secondary | ICD-10-CM

## 2023-07-31 MED ORDER — AMPHETAMINE-DEXTROAMPHETAMINE 30 MG PO TABS: 30.0000 mg | ORAL_TABLET | Freq: Every day | ORAL | 0 refills | Status: DC

## 2023-07-31 MED ORDER — AMPHETAMINE-DEXTROAMPHETAMINE 15 MG PO TABS: 15.0000 mg | ORAL_TABLET | Freq: Every day | ORAL | 0 refills | Status: DC

## 2023-07-31 NOTE — Progress Notes (Signed)
 [facility]  Patient ID: Katherine Keith,  MRN: [id number]  PCP: Wilfrid Lund, PA  Date of Evaluation: 07/31/2023 Time spent:20 minutes  Chief Complaint:  Chief Complaint   ADHD; Follow-up    HISTORY/CURRENT STATUS:  HPI For routine med check.   She's doing well. States that attention is good without easy distractibility.  Able to focus on things and finish tasks to completion.   Patient is able to enjoy things.  Energy and motivation are good.  Work is going well.   No extreme sadness, tearfulness, or feelings of hopelessness.  Sleeps well most of the time. ADLs and personal hygiene are normal.   Appetite has not changed.  Weight is stable. No reports of anxiety.   Denies suicidal or homicidal thoughts.  Denies dizziness, syncope, seizures, numbness, tingling, tremor, tics, unsteady gait, slurred speech, confusion. Denies muscle or joint pain, stiffness, or dystonia.  Individual Medical History/ Review of Systems: Changes? :No    Past medications for mental health diagnoses include: Unknown  Allergies: Patient has no known allergies.  Current Medications:  Current Outpatient Medications:    levonorgestrel (MIRENA) 20 MCG/24HR IUD, 1 each by Intrauterine route once., Disp: , Rfl:    Multiple Vitamin (MULTIVITAMIN) tablet, Take 1 tablet by mouth daily., Disp: , Rfl:    [START ON 09/26/2023] amphetamine-dextroamphetamine (ADDERALL) 15 MG tablet, Take 1 tablet by mouth daily at 12 noon., Disp: 30 tablet, Rfl: 0   [START ON 08/27/2023] amphetamine-dextroamphetamine (ADDERALL) 15 MG tablet, Take 1 tablet by mouth daily at 12 noon., Disp: 30 tablet, Rfl: 0   amphetamine-dextroamphetamine (ADDERALL) 15 MG tablet, Take 1 tablet by mouth daily at 12 noon., Disp: 30 tablet, Rfl: 0   [START ON 09/26/2023] amphetamine-dextroamphetamine (ADDERALL) 30 MG tablet, Take 1 tablet by mouth daily., Disp: 30 tablet, Rfl: 0   [START ON 08/27/2023] amphetamine-dextroamphetamine (ADDERALL)  30 MG tablet, Take 1 tablet by mouth daily., Disp: 30 tablet, Rfl: 0   amphetamine-dextroamphetamine (ADDERALL) 30 MG tablet, Take 1 tablet by mouth daily., Disp: 30 tablet, Rfl: 0 Medication Side Effects: none  Family Medical/ Social History: Changes?  No  MENTAL HEALTH EXAM:  There were no vitals taken for this visit.There is no height or weight on file to calculate BMI.  General: casual, well groomed  Eye Contact:  Good  Speech:  Clear and Coherent and Normal Rate  Volume:  Normal  Mood:  Euthymic  Affect:  Congruent  Thought Process:  Goal Directed and Descriptions of Associations: Circumstantial  Orientation:  Full (Time, Place, and Person)  Thought Content: Logical   Suicidal Thoughts:  No  Homicidal Thoughts:  No  Memory:  WNL  Judgement:  Good  Insight:  Good  Psychomotor Activity:  Normal  Concentration:  Concentration: Good and Attention Span: Good  Recall:  Good  Fund of Knowledge: Good  Language: Good  Assets:  Desire for Improvement Financial Resources/Insurance Housing Transportation Vocational/Educational  ADL's:  Intact  Cognition: WNL  Prognosis:  Good   DIAGNOSES:    ICD-10-CM   1. Attention deficit hyperactivity disorder (ADHD), unspecified ADHD type  F90.9     2. Generalized anxiety disorder  F41.1       Receiving Psychotherapy: No   RECOMMENDATIONS:  PDMP was reviewed.  Last Adderall 06/25/2023. I provided 20 minutes of face to face time during this encounter, including time spent before and after the visit in records review, medical decision making, counseling pertinent to today's visit, and charting.   She is  doing well so no changes need to be made.  Continue Adderall 30 mg every morning. Continue Adderall 15 mg daily around noon. Return in 6 months.  Melony Overly, PA-C

## 2023-09-19 ENCOUNTER — Ambulatory Visit: Payer: Self-pay | Admitting: Orthopedic Surgery

## 2023-09-19 DIAGNOSIS — M5126 Other intervertebral disc displacement, lumbar region: Secondary | ICD-10-CM

## 2023-09-19 NOTE — H&P (View-Only) (Signed)
 Katherine Keith is an 38 y.o. female.   Chief Complaint: back and right leg pain HPI: Reason for Visit: low back Context: 4.5 weeks Location (Lower Extremity): lower back pain ; tingling in the right foot Severity: pain level 4-5/10 Associated Symptoms: numbness/tingling Medications: The patient is taking Robaxin Notes: The patient is 2 days following an ESI right L5-S1 Reports pain radiating down the leg further. Yesterday she was in tears, getting out of bed. No loss of bowel or bladder function. No significant help from her injection. It seemingly the injection made her worse.  No past medical history on file.  Past Surgical History:  Procedure Laterality Date   BREAST ENHANCEMENT SURGERY      No family history on file. Social History:  reports that she has never smoked. She has never used smokeless tobacco. She reports current alcohol use of about 1.0 - 3.0 standard drink of alcohol per week. She reports that she does not use drugs.  Allergies: No Known Allergies  Current meds: dextroamphetamine-amphetamine ibuprofen methocarbamoL 500 mg tablet Neurontin 300 mg capsule oxyCODONE-acetaminophen 10 mg-325 mg tablet  Review of Systems  Constitutional: Negative.   HENT: Negative.    Eyes: Negative.   Respiratory: Negative.    Cardiovascular: Negative.   Gastrointestinal: Negative.   Endocrine: Negative.   Genitourinary: Negative.   Musculoskeletal:  Positive for back pain and gait problem.  Neurological:  Positive for weakness and numbness.  Psychiatric/Behavioral: Negative.      There were no vitals taken for this visit. Physical Exam Constitutional:      Appearance: Normal appearance.  HENT:     Head: Normocephalic and atraumatic.     Right Ear: External ear normal.     Left Ear: External ear normal.     Nose: Nose normal.     Mouth/Throat:     Pharynx: Oropharynx is clear.  Eyes:     Conjunctiva/sclera: Conjunctivae normal.  Cardiovascular:     Rate and  Rhythm: Normal rate and regular rhythm.     Pulses: Normal pulses.  Pulmonary:     Effort: Pulmonary effort is normal.     Breath sounds: Normal breath sounds.  Abdominal:     General: Bowel sounds are normal.  Musculoskeletal:     Cervical back: Normal range of motion.     Comments: Gait and Station: Appearance: ambulating with no assistive devices and antalgic gait.  Constitutional: General Appearance: healthy-appearing and distress (mild).  Psychiatric: Mood and Affect: active and alert.  Cardiovascular System: Edema Right: none; Dorsalis and posterior tibial pulses 2+. Edema Left: none.  Abdomen: Inspection and Palpation: non-distended and no tenderness.  Skin: Inspection and palpation: no rash.  Lumbar Spine: Inspection: normal alignment. Bony Palpation of the Lumbar Spine: tender at lumbosacral junction.. Bony Palpation of the Right Hip: no tenderness of the greater trochanter and tenderness of the SI joint; Pelvis stable. Bony Palpation of the Left Hip: no tenderness of the greater trochanter and tenderness of the SI joint. Soft Tissue Palpation on the Right: No flank pain with percussion. Active Range of Motion: limited flexion and extention.  Motor Strength: L1 Motor Strength on the Right: hip flexion iliopsoas 5/5. L1 Motor Strength on the Left: hip flexion iliopsoas 5/5. L2-L4 Motor Strength on the Right: knee extension quadriceps 5/5. L2-L4 Motor Strength on the Left: knee extension quadriceps 5/5. L5 Motor Strength on the Right: ankle dorsiflexion tibialis anterior 5/5 and great toe extension extensor hallucis longus 5/5. L5 Motor Strength on the Left: ankle  dorsiflexion tibialis anterior 5/5 and great toe extension extensor hallucis longus 5/5. S1 Motor Strength on the Right: plantar flexion gastrocnemius 4/5. S1 Motor Strength on the Left: plantar flexion gastrocnemius 5/5.  Neurological System: Knee Reflex Right: normal (2). Knee Reflex Left: normal (2). Ankle Reflex Right:  diminished (1). Ankle Reflex Left: normal (2). Babinski Reflex Right: plantar reflex absent. Babinski Reflex Left: plantar reflex absent. Sensation on the Right: normal distal extremities and dereased sensation on the sole of the foot and the posterior leg (S1). Sensation on the Left: normal distal extremities. Special Tests on the Right: no clonus of the ankle/knee and seated straight leg raising test positive. Special Tests on the Left: no clonus of the ankle/knee.  Neurological:     Mental Status: She is alert.    MRI was reviewed with the patient and her husband. It shows a focal disc herniation L5-S1 to the right associated disc degeneration L5-S1. Mild bulging at L3-4.  Assessment/Plan Impression:  1. Progressive S1 radiculopathy secondary focal disc herniation L5-S1 right with progressive weakness dermatomal dysesthesias numbness in the S1 nerve root distribution and diminished with plantarflexion and a markedly positive straight leg raise  Plan:  Discussed options conservative versus operative. Given the progression needs wise to proceed with a microdiscectomy the patient and her husband are in agreement with this. I had an extensive discussion with the patient concerning the pathology relevant anatomy and treatment options. At this point exhausting conservative treatment and in the presence of a neurologic deficit we discussed microlumbar decompression. I discussed the risks and benefits including bleeding, infection, DVT, PE, anesthetic complications, worsening in their symptoms, improvement in their symptoms, C SF leakage, epidural fibrosis, need for future surgeries such as revision discectomy and lumbar fusion. I also indicated that this is an operation to basically decompress the nerve roots to allow recovery as opposed to fixing a herniated disc if it is encountered and that the incidence of recurrent chest disc herniation can approach 15%. Also that nerve root recovery is variable and  may not recover completely. Any ligament or bone that is contributing to compressing the nerves will be removed as well.  I discussed the operative course including overnight in the hospital. Immediate ambulation. Follow-up in 2 weeks for suture removal. 6 weeks until healing of the herniation and surgical incision followed by 6 weeks of reconditioning and strengthening of the core musculature. Also discussed the need to employ the concepts of disc pressure management and core motion following the surgery to minimize the risk of recurrent disc herniation. We will obtain preoperative clearance i if necessary and proceed accordingly.  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  She does have an appointment to physical therapy next week if there is any dramatic reduction of her symptoms we can always change course in terms of the surgery.  Plan microdiscectomy L5-S1 right  Barba Levin, PA-C for Dr Leighton Punches 09/19/2023, 1:20 PM

## 2023-09-19 NOTE — H&P (Signed)
 Katherine Keith is an 38 y.o. female.   Chief Complaint: back and right leg pain HPI: Reason for Visit: low back Context: 4.5 weeks Location (Lower Extremity): lower back pain ; tingling in the right foot Severity: pain level 4-5/10 Associated Symptoms: numbness/tingling Medications: The patient is taking Robaxin Notes: The patient is 2 days following an ESI right L5-S1 Reports pain radiating down the leg further. Yesterday she was in tears, getting out of bed. No loss of bowel or bladder function. No significant help from her injection. It seemingly the injection made her worse.  No past medical history on file.  Past Surgical History:  Procedure Laterality Date   BREAST ENHANCEMENT SURGERY      No family history on file. Social History:  reports that she has never smoked. She has never used smokeless tobacco. She reports current alcohol use of about 1.0 - 3.0 standard drink of alcohol per week. She reports that she does not use drugs.  Allergies: No Known Allergies  Current meds: dextroamphetamine-amphetamine ibuprofen methocarbamoL 500 mg tablet Neurontin 300 mg capsule oxyCODONE-acetaminophen 10 mg-325 mg tablet  Review of Systems  Constitutional: Negative.   HENT: Negative.    Eyes: Negative.   Respiratory: Negative.    Cardiovascular: Negative.   Gastrointestinal: Negative.   Endocrine: Negative.   Genitourinary: Negative.   Musculoskeletal:  Positive for back pain and gait problem.  Neurological:  Positive for weakness and numbness.  Psychiatric/Behavioral: Negative.      There were no vitals taken for this visit. Physical Exam Constitutional:      Appearance: Normal appearance.  HENT:     Head: Normocephalic and atraumatic.     Right Ear: External ear normal.     Left Ear: External ear normal.     Nose: Nose normal.     Mouth/Throat:     Pharynx: Oropharynx is clear.  Eyes:     Conjunctiva/sclera: Conjunctivae normal.  Cardiovascular:     Rate and  Rhythm: Normal rate and regular rhythm.     Pulses: Normal pulses.  Pulmonary:     Effort: Pulmonary effort is normal.     Breath sounds: Normal breath sounds.  Abdominal:     General: Bowel sounds are normal.  Musculoskeletal:     Cervical back: Normal range of motion.     Comments: Gait and Station: Appearance: ambulating with no assistive devices and antalgic gait.  Constitutional: General Appearance: healthy-appearing and distress (mild).  Psychiatric: Mood and Affect: active and alert.  Cardiovascular System: Edema Right: none; Dorsalis and posterior tibial pulses 2+. Edema Left: none.  Abdomen: Inspection and Palpation: non-distended and no tenderness.  Skin: Inspection and palpation: no rash.  Lumbar Spine: Inspection: normal alignment. Bony Palpation of the Lumbar Spine: tender at lumbosacral junction.. Bony Palpation of the Right Hip: no tenderness of the greater trochanter and tenderness of the SI joint; Pelvis stable. Bony Palpation of the Left Hip: no tenderness of the greater trochanter and tenderness of the SI joint. Soft Tissue Palpation on the Right: No flank pain with percussion. Active Range of Motion: limited flexion and extention.  Motor Strength: L1 Motor Strength on the Right: hip flexion iliopsoas 5/5. L1 Motor Strength on the Left: hip flexion iliopsoas 5/5. L2-L4 Motor Strength on the Right: knee extension quadriceps 5/5. L2-L4 Motor Strength on the Left: knee extension quadriceps 5/5. L5 Motor Strength on the Right: ankle dorsiflexion tibialis anterior 5/5 and great toe extension extensor hallucis longus 5/5. L5 Motor Strength on the Left: ankle  dorsiflexion tibialis anterior 5/5 and great toe extension extensor hallucis longus 5/5. S1 Motor Strength on the Right: plantar flexion gastrocnemius 4/5. S1 Motor Strength on the Left: plantar flexion gastrocnemius 5/5.  Neurological System: Knee Reflex Right: normal (2). Knee Reflex Left: normal (2). Ankle Reflex Right:  diminished (1). Ankle Reflex Left: normal (2). Babinski Reflex Right: plantar reflex absent. Babinski Reflex Left: plantar reflex absent. Sensation on the Right: normal distal extremities and dereased sensation on the sole of the foot and the posterior leg (S1). Sensation on the Left: normal distal extremities. Special Tests on the Right: no clonus of the ankle/knee and seated straight leg raising test positive. Special Tests on the Left: no clonus of the ankle/knee.  Neurological:     Mental Status: She is alert.    MRI was reviewed with the patient and her husband. It shows a focal disc herniation L5-S1 to the right associated disc degeneration L5-S1. Mild bulging at L3-4.  Assessment/Plan Impression:  1. Progressive S1 radiculopathy secondary focal disc herniation L5-S1 right with progressive weakness dermatomal dysesthesias numbness in the S1 nerve root distribution and diminished with plantarflexion and a markedly positive straight leg raise  Plan:  Discussed options conservative versus operative. Given the progression needs wise to proceed with a microdiscectomy the patient and her husband are in agreement with this. I had an extensive discussion with the patient concerning the pathology relevant anatomy and treatment options. At this point exhausting conservative treatment and in the presence of a neurologic deficit we discussed microlumbar decompression. I discussed the risks and benefits including bleeding, infection, DVT, PE, anesthetic complications, worsening in their symptoms, improvement in their symptoms, C SF leakage, epidural fibrosis, need for future surgeries such as revision discectomy and lumbar fusion. I also indicated that this is an operation to basically decompress the nerve roots to allow recovery as opposed to fixing a herniated disc if it is encountered and that the incidence of recurrent chest disc herniation can approach 15%. Also that nerve root recovery is variable and  may not recover completely. Any ligament or bone that is contributing to compressing the nerves will be removed as well.  I discussed the operative course including overnight in the hospital. Immediate ambulation. Follow-up in 2 weeks for suture removal. 6 weeks until healing of the herniation and surgical incision followed by 6 weeks of reconditioning and strengthening of the core musculature. Also discussed the need to employ the concepts of disc pressure management and core motion following the surgery to minimize the risk of recurrent disc herniation. We will obtain preoperative clearance i if necessary and proceed accordingly.  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  She does have an appointment to physical therapy next week if there is any dramatic reduction of her symptoms we can always change course in terms of the surgery.  Plan microdiscectomy L5-S1 right  Barba Levin, PA-C for Dr Leighton Punches 09/19/2023, 1:20 PM

## 2023-09-25 NOTE — Pre-Procedure Instructions (Signed)
 Surgical Instructions   Your procedure is scheduled on September 27, 2023. Report to [facility] Main Entrance "A" at 8:10 A.M., then check in with the Admitting office. Any questions or running late day of surgery: call [phone]  Questions prior to your surgery date: call [phone], Monday-Friday, 8am-4pm. If you experience any cold or flu symptoms such as cough, fever, chills, shortness of breath, etc. between now and your scheduled surgery, please notify us  at the above number.     Remember:  Do not eat after midnight the night before your surgery   You may drink clear liquids until 7:10 AM the morning of your surgery.   Clear liquids allowed are: Water, Non-Citrus Juices (without pulp), Carbonated Beverages, Clear Tea (no milk, honey, etc.), Black Coffee Only (NO MILK, CREAM OR POWDERED CREAMER of any kind), and Gatorade.  Patient Instructions  The night before surgery:  No food after midnight. ONLY clear liquids after midnight  The day of surgery (if you do NOT have diabetes):  Drink ONE (1) Pre-Surgery Clear Ensure by 7:10 AM the morning of surgery. Drink in one sitting. Do not sip.  This drink was given to you during your hospital  pre-op appointment visit.  Nothing else to drink after completing the  Pre-Surgery Clear Ensure.         If you have questions, please contact your surgeon's office.    Take these medicines the morning of surgery with A SIP OF WATER: methocarbamol  (ROBAXIN ) - may take if needed Oxycodone  - may take if needed   One week prior to surgery, STOP taking any Aspirin (unless otherwise instructed by your surgeon) Aleve , Naproxen , Ibuprofen, Motrin, Advil, Goody's, BC's, all herbal medications, fish oil, and non-prescription vitamins.                     Do NOT Smoke (Tobacco/Vaping) for 24 hours prior to your procedure.  If you use a CPAP at night, you may bring your mask/headgear for your overnight stay.   You will be asked to remove any  contacts, glasses, piercing's, hearing aid's, dentures/partials prior to surgery. Please bring cases for these items if needed.    Patients discharged the day of surgery will not be allowed to drive home, and someone needs to stay with them for 24 hours.  SURGICAL WAITING ROOM VISITATION Patients may have no more than 2 support people in the waiting area - these visitors may rotate.   Pre-op nurse will coordinate an appropriate time for 1 ADULT support person, who may not rotate, to accompany patient in pre-op.  Children under the age of 36 must have an adult with them who is not the patient and must remain in the main waiting area with an adult.  If the patient needs to stay at the hospital during part of their recovery, the visitor guidelines for inpatient rooms apply.  Please refer to the [facility] website for the visitor guidelines for any additional information.   If you received a COVID test during your pre-op visit  it is requested that you wear a mask when out in public, stay away from anyone that may not be feeling well and notify your surgeon if you develop symptoms. If you have been in contact with anyone that has tested positive in the last 10 days please notify you surgeon.      Pre-operative CHG Bathing Instructions   You can play a key role in reducing the risk of infection after surgery.  Your skin needs to be as free of germs as possible. You can reduce the number of germs on your skin by washing with CHG (chlorhexidine  gluconate) soap before surgery. CHG is an antiseptic soap that kills germs and continues to kill germs even after washing.   DO NOT use if you have an allergy to chlorhexidine /CHG or antibacterial soaps. If your skin becomes reddened or irritated, stop using the CHG and notify one of our RNs at [phone].              TAKE A SHOWER THE NIGHT BEFORE SURGERY AND THE DAY OF SURGERY    Please keep in mind the following:  DO NOT shave, including legs and  underarms, 48 hours prior to surgery.   You may shave your face before/day of surgery.  Place clean sheets on your bed the night before surgery Use a clean washcloth (not used since being washed) for each shower. DO NOT sleep with pet's night before surgery.  CHG Shower Instructions:  Wash your face and private area with normal soap. If you choose to wash your hair, wash first with your normal shampoo.  After you use shampoo/soap, rinse your hair and body thoroughly to remove shampoo/soap residue.  Turn the water OFF and apply half the bottle of CHG soap to a CLEAN washcloth.  Apply CHG soap ONLY FROM YOUR NECK DOWN TO YOUR TOES (washing for 3-5 minutes)  DO NOT use CHG soap on face, private areas, open wounds, or sores.  Pay special attention to the area where your surgery is being performed.  If you are having back surgery, having someone wash your back for you may be helpful. Wait 2 minutes after CHG soap is applied, then you may rinse off the CHG soap.  Pat dry with a clean towel  Put on clean pajamas    Additional instructions for the day of surgery: DO NOT APPLY any lotions, deodorants, cologne, or perfumes.   Do not wear jewelry or makeup Do not wear nail polish, gel polish, artificial nails, or any other type of covering on natural nails (fingers and toes) Do not bring valuables to the hospital. [facility] is not responsible for valuables/personal belongings. Put on clean/comfortable clothes.  Please brush your teeth.  Ask your nurse before applying any prescription medications to the skin.

## 2023-09-26 ENCOUNTER — Other Ambulatory Visit (HOSPITAL_COMMUNITY)

## 2023-09-26 ENCOUNTER — Other Ambulatory Visit: Payer: Self-pay

## 2023-09-26 ENCOUNTER — Encounter (HOSPITAL_COMMUNITY): Payer: Self-pay

## 2023-09-26 ENCOUNTER — Encounter (HOSPITAL_COMMUNITY)
Admission: RE | Admit: 2023-09-26 | Discharge: 2023-09-26 | Disposition: A | Discharge status: Home / Self Care | Source: Ambulatory Visit | Attending: Specialist | Admitting: Specialist

## 2023-09-26 ENCOUNTER — Ambulatory Visit (HOSPITAL_COMMUNITY)
Admission: RE | Admit: 2023-09-26 | Discharge: 2023-09-26 | Disposition: A | Discharge status: Home / Self Care | Source: Ambulatory Visit | Attending: Orthopedic Surgery | Admitting: Orthopedic Surgery

## 2023-09-26 VITALS — BP 134/83 | HR 83 | Temp 98.6°F | Resp 16 | Ht 70.0 in | Wt 163.0 lb

## 2023-09-26 DIAGNOSIS — Z01818 Encounter for other preprocedural examination: Secondary | ICD-10-CM | POA: Insufficient documentation

## 2023-09-26 DIAGNOSIS — M5126 Other intervertebral disc displacement, lumbar region: Secondary | ICD-10-CM | POA: Insufficient documentation

## 2023-09-26 HISTORY — DX: Attention-deficit hyperactivity disorder, unspecified type: F90.9

## 2023-09-26 HISTORY — DX: Pneumonia, unspecified organism: J18.9

## 2023-09-26 HISTORY — DX: Gastro-esophageal reflux disease without esophagitis: K21.9

## 2023-09-26 LAB — CBC
HCT: 43.9 % (ref 36.0–46.0)
Hemoglobin: 14.8 g/dL (ref 12.0–15.0)
MCH: 29.7 pg (ref 26.0–34.0)
MCHC: 33.7 g/dL (ref 30.0–36.0)
MCV: 88 fL (ref 80.0–100.0)
Platelets: 407 10*3/uL — ABNORMAL HIGH (ref 150–400)
RBC: 4.99 MIL/uL (ref 3.87–5.11)
RDW: 13.3 % (ref 11.5–15.5)
WBC: 6.1 10*3/uL (ref 4.0–10.5)
nRBC: 0 % (ref 0.0–0.2)

## 2023-09-26 LAB — SURGICAL PCR SCREEN
MRSA, PCR: NEGATIVE
Staphylococcus aureus: POSITIVE — AB

## 2023-09-26 NOTE — Progress Notes (Addendum)
 PCP - Barnet Lias, NP Cherene Core on [employer] - Denies  PPM/ICD - Denies Device Orders - n/a Rep Notified - n/a  Chest x-ray - Denies EKG - Denies Stress Test - Denies ECHO - Denies Cardiac Cath - denies  Sleep Study - Denies CPAP - n/a  No DM  Last dose of GLP1 agonist- n/a GLP1 instructions: n/a  Blood Thinner Instructions: n/a Aspirin Instructions: n/a  ERAS Protcol - Clear liquids until 0710 morning of surgery PRE-SURGERY Ensure or G2- Ensure given to pt with instructions  COVID TEST- n/a   Anesthesia review: No  Patient denies shortness of breath, fever, cough and chest pain at PAT appointment. Pt denies any respiratory illness/infection in the last two months.   All instructions explained to the patient, with a verbal understanding of the material. Patient agrees to go over the instructions while at home for a better understanding. Patient also instructed to self quarantine after being tested for COVID-19. The opportunity to ask questions was provided.

## 2023-09-27 ENCOUNTER — Ambulatory Visit (HOSPITAL_BASED_OUTPATIENT_CLINIC_OR_DEPARTMENT_OTHER): Admitting: Anesthesiology

## 2023-09-27 ENCOUNTER — Ambulatory Visit (HOSPITAL_COMMUNITY)
Admission: RE | Admit: 2023-09-27 | Discharge: 2023-09-27 | Disposition: A | Discharge status: Home / Self Care | Attending: Specialist | Admitting: Specialist

## 2023-09-27 ENCOUNTER — Ambulatory Visit (HOSPITAL_COMMUNITY): Admitting: Anesthesiology

## 2023-09-27 ENCOUNTER — Encounter (HOSPITAL_COMMUNITY)
Admission: RE | Disposition: A | Discharge status: Home / Self Care | Payer: Self-pay | Source: Home / Self Care | Attending: Specialist

## 2023-09-27 ENCOUNTER — Encounter (HOSPITAL_COMMUNITY): Payer: Self-pay | Admitting: Specialist

## 2023-09-27 ENCOUNTER — Other Ambulatory Visit: Payer: Self-pay

## 2023-09-27 ENCOUNTER — Ambulatory Visit (HOSPITAL_COMMUNITY)

## 2023-09-27 DIAGNOSIS — M5117 Intervertebral disc disorders with radiculopathy, lumbosacral region: Secondary | ICD-10-CM | POA: Insufficient documentation

## 2023-09-27 DIAGNOSIS — Z01818 Encounter for other preprocedural examination: Secondary | ICD-10-CM

## 2023-09-27 DIAGNOSIS — F909 Attention-deficit hyperactivity disorder, unspecified type: Secondary | ICD-10-CM | POA: Diagnosis not present

## 2023-09-27 DIAGNOSIS — M5126 Other intervertebral disc displacement, lumbar region: Secondary | ICD-10-CM | POA: Diagnosis present

## 2023-09-27 HISTORY — PX: LUMBAR LAMINECTOMY/DECOMPRESSION MICRODISCECTOMY: SHX5026

## 2023-09-27 LAB — POCT PREGNANCY, URINE: Preg Test, Ur: NEGATIVE

## 2023-09-27 SURGERY — LUMBAR LAMINECTOMY/DECOMPRESSION MICRODISCECTOMY 1 LEVEL
Anesthesia: General | Laterality: Right

## 2023-09-27 MED ORDER — DEXAMETHASONE SODIUM PHOSPHATE 10 MG/ML IJ SOLN
INTRAMUSCULAR | Status: AC
  Filled 2023-09-27: qty 2

## 2023-09-27 MED ORDER — EPHEDRINE 5 MG/ML INJ
INTRAVENOUS | Status: AC
  Filled 2023-09-27: qty 5

## 2023-09-27 MED ORDER — METHOCARBAMOL 1000 MG/10ML IJ SOLN: 500.0000 mg | Freq: Four times a day (QID) | INTRAMUSCULAR | Status: DC | PRN

## 2023-09-27 MED ORDER — CEFAZOLIN SODIUM-DEXTROSE 2-4 GM/100ML-% IV SOLN
2.0000 g | INTRAVENOUS | Status: AC
  Administered 2023-09-27: 2 g via INTRAVENOUS

## 2023-09-27 MED ORDER — TRANEXAMIC ACID-NACL 1000-0.7 MG/100ML-% IV SOLN
INTRAVENOUS | Status: AC
  Filled 2023-09-27: qty 100

## 2023-09-27 MED ORDER — DEXAMETHASONE SODIUM PHOSPHATE 10 MG/ML IJ SOLN
INTRAMUSCULAR | Status: DC | PRN
  Administered 2023-09-27: 10 mg via INTRAVENOUS

## 2023-09-27 MED ORDER — FENTANYL CITRATE (PF) 250 MCG/5ML IJ SOLN
INTRAMUSCULAR | Status: AC
Start: 2023-09-27 — End: ?
  Filled 2023-09-27: qty 5

## 2023-09-27 MED ORDER — ALUM & MAG HYDROXIDE-SIMETH 200-200-20 MG/5ML PO SUSP: 30.0000 mL | Freq: Four times a day (QID) | ORAL | Status: DC | PRN

## 2023-09-27 MED ORDER — TRANEXAMIC ACID-NACL 1000-0.7 MG/100ML-% IV SOLN
1000.0000 mg | INTRAVENOUS | Status: AC
  Administered 2023-09-27: 1000 mg via INTRAVENOUS

## 2023-09-27 MED ORDER — OXYCODONE HCL 5 MG PO TABS: 5.0000 mg | ORAL_TABLET | ORAL | Status: DC | PRN

## 2023-09-27 MED ORDER — THROMBIN 20000 UNITS EX SOLR
CUTANEOUS | Status: DC | PRN
  Administered 2023-09-27: 20 mL via TOPICAL

## 2023-09-27 MED ORDER — LIDOCAINE 2% (20 MG/ML) 5 ML SYRINGE
INTRAMUSCULAR | Status: DC | PRN
  Administered 2023-09-27: 80 mg via INTRAVENOUS

## 2023-09-27 MED ORDER — PHENYLEPHRINE 80 MCG/ML (10ML) SYRINGE FOR IV PUSH (FOR BLOOD PRESSURE SUPPORT)
PREFILLED_SYRINGE | INTRAVENOUS | Status: DC | PRN
  Administered 2023-09-27 (×2): 80 ug via INTRAVENOUS
  Administered 2023-09-27: 120 ug via INTRAVENOUS

## 2023-09-27 MED ORDER — LIDOCAINE 2% (20 MG/ML) 5 ML SYRINGE
INTRAMUSCULAR | Status: AC
  Filled 2023-09-27: qty 10

## 2023-09-27 MED ORDER — PHENYLEPHRINE HCL-NACL 20-0.9 MG/250ML-% IV SOLN
INTRAVENOUS | Status: DC | PRN
  Administered 2023-09-27: 25 ug/min via INTRAVENOUS

## 2023-09-27 MED ORDER — HYDROMORPHONE HCL 1 MG/ML IJ SOLN: 1.0000 mg | INTRAMUSCULAR | Status: DC | PRN

## 2023-09-27 MED ORDER — OXYCODONE HCL 10 MG PO TABS: 10.0000 mg | ORAL_TABLET | ORAL | 0 refills | Status: AC | PRN

## 2023-09-27 MED ORDER — RISAQUAD PO CAPS
1.0000 | ORAL_CAPSULE | Freq: Every day | ORAL | Status: DC
Start: 2023-09-27 — End: 2023-09-27
  Administered 2023-09-27: 1 via ORAL
  Filled 2023-09-27: qty 1

## 2023-09-27 MED ORDER — PROPOFOL 10 MG/ML IV BOLUS
INTRAVENOUS | Status: DC | PRN
  Administered 2023-09-27: 160 mg via INTRAVENOUS

## 2023-09-27 MED ORDER — SODIUM CHLORIDE 0.9 % IV SOLN: INTRAVENOUS | Status: DC | PRN

## 2023-09-27 MED ORDER — POLYETHYLENE GLYCOL 3350 17 G PO PACK: 17.0000 g | PACK | Freq: Every day | ORAL | 0 refills | Status: AC

## 2023-09-27 MED ORDER — MIDAZOLAM HCL 2 MG/2ML IJ SOLN
INTRAMUSCULAR | Status: DC | PRN
  Administered 2023-09-27: 2 mg via INTRAVENOUS

## 2023-09-27 MED ORDER — METHOCARBAMOL 500 MG PO TABS
500.0000 mg | ORAL_TABLET | Freq: Four times a day (QID) | ORAL | Status: DC | PRN
  Administered 2023-09-27: 500 mg via ORAL
  Filled 2023-09-27: qty 1

## 2023-09-27 MED ORDER — CEFAZOLIN SODIUM-DEXTROSE 2-4 GM/100ML-% IV SOLN
2.0000 g | Freq: Three times a day (TID) | INTRAVENOUS | Status: DC
  Administered 2023-09-27: 2 g via INTRAVENOUS
  Filled 2023-09-27: qty 100

## 2023-09-27 MED ORDER — PHENOL 1.4 % MT LIQD: 1.0000 | OROMUCOSAL | Status: DC | PRN

## 2023-09-27 MED ORDER — ROCURONIUM BROMIDE 10 MG/ML (PF) SYRINGE
PREFILLED_SYRINGE | INTRAVENOUS | Status: DC | PRN
  Administered 2023-09-27: 20 mg via INTRAVENOUS
  Administered 2023-09-27: 60 mg via INTRAVENOUS

## 2023-09-27 MED ORDER — ACETAMINOPHEN 325 MG PO TABS: 650.0000 mg | ORAL_TABLET | ORAL | Status: DC | PRN

## 2023-09-27 MED ORDER — CHLORHEXIDINE GLUCONATE 0.12 % MT SOLN
OROMUCOSAL | Status: AC
  Administered 2023-09-27: 15 mL via OROMUCOSAL
  Filled 2023-09-27: qty 15

## 2023-09-27 MED ORDER — OXYCODONE HCL 5 MG PO TABS
10.0000 mg | ORAL_TABLET | ORAL | Status: DC | PRN
  Administered 2023-09-27: 10 mg via ORAL
  Filled 2023-09-27: qty 2

## 2023-09-27 MED ORDER — BUPIVACAINE-EPINEPHRINE (PF) 0.5% -1:200000 IJ SOLN
INTRAMUSCULAR | Status: AC
  Filled 2023-09-27: qty 30

## 2023-09-27 MED ORDER — ACETAMINOPHEN 650 MG RE SUPP: 650.0000 mg | RECTAL | Status: DC | PRN

## 2023-09-27 MED ORDER — FENTANYL CITRATE (PF) 250 MCG/5ML IJ SOLN
INTRAMUSCULAR | Status: DC | PRN
  Administered 2023-09-27: 100 ug via INTRAVENOUS

## 2023-09-27 MED ORDER — ROCURONIUM BROMIDE 10 MG/ML (PF) SYRINGE
PREFILLED_SYRINGE | INTRAVENOUS | Status: AC
  Filled 2023-09-27: qty 20

## 2023-09-27 MED ORDER — METHOCARBAMOL 500 MG PO TABS: 500.0000 mg | ORAL_TABLET | Freq: Three times a day (TID) | ORAL | 1 refills | Status: AC | PRN

## 2023-09-27 MED ORDER — ORAL CARE MOUTH RINSE: 15.0000 mL | Freq: Once | OROMUCOSAL | Status: AC

## 2023-09-27 MED ORDER — THROMBIN 20000 UNITS EX SOLR
CUTANEOUS | Status: AC
  Filled 2023-09-27: qty 20000

## 2023-09-27 MED ORDER — ONDANSETRON HCL 4 MG/2ML IJ SOLN: 4.0000 mg | Freq: Once | INTRAMUSCULAR | Status: DC | PRN

## 2023-09-27 MED ORDER — BUPIVACAINE-EPINEPHRINE 0.5% -1:200000 IJ SOLN
INTRAMUSCULAR | Status: DC | PRN
  Administered 2023-09-27: 3 mL

## 2023-09-27 MED ORDER — POLYETHYLENE GLYCOL 3350 17 G PO PACK: 17.0000 g | PACK | Freq: Every day | ORAL | Status: DC | PRN

## 2023-09-27 MED ORDER — CHLORHEXIDINE GLUCONATE 0.12 % MT SOLN: 15.0000 mL | Freq: Once | OROMUCOSAL | Status: AC

## 2023-09-27 MED ORDER — FENTANYL CITRATE (PF) 100 MCG/2ML IJ SOLN
INTRAMUSCULAR | Status: AC
  Filled 2023-09-27: qty 2

## 2023-09-27 MED ORDER — 0.9 % SODIUM CHLORIDE (POUR BTL) OPTIME
TOPICAL | Status: DC | PRN
  Administered 2023-09-27: 1000 mL

## 2023-09-27 MED ORDER — MENTHOL 3 MG MT LOZG: 1.0000 | LOZENGE | OROMUCOSAL | Status: DC | PRN

## 2023-09-27 MED ORDER — AMPHETAMINE-DEXTROAMPHETAMINE 10 MG PO TABS: 15.0000 mg | ORAL_TABLET | Freq: Every day | ORAL | Status: DC

## 2023-09-27 MED ORDER — ONDANSETRON HCL 4 MG/2ML IJ SOLN
INTRAMUSCULAR | Status: DC | PRN
  Administered 2023-09-27: 4 mg via INTRAVENOUS

## 2023-09-27 MED ORDER — LACTATED RINGERS IV SOLN: INTRAVENOUS | Status: DC

## 2023-09-27 MED ORDER — ACETAMINOPHEN 10 MG/ML IV SOLN
INTRAVENOUS | Status: AC
  Filled 2023-09-27: qty 100

## 2023-09-27 MED ORDER — ONDANSETRON HCL 4 MG PO TABS: 4.0000 mg | ORAL_TABLET | Freq: Four times a day (QID) | ORAL | Status: DC | PRN

## 2023-09-27 MED ORDER — PHENYLEPHRINE 80 MCG/ML (10ML) SYRINGE FOR IV PUSH (FOR BLOOD PRESSURE SUPPORT)
PREFILLED_SYRINGE | INTRAVENOUS | Status: AC
  Filled 2023-09-27: qty 10

## 2023-09-27 MED ORDER — CEFAZOLIN SODIUM-DEXTROSE 2-4 GM/100ML-% IV SOLN
INTRAVENOUS | Status: AC
  Filled 2023-09-27: qty 100

## 2023-09-27 MED ORDER — BISACODYL 5 MG PO TBEC: 5.0000 mg | DELAYED_RELEASE_TABLET | Freq: Every day | ORAL | Status: DC | PRN

## 2023-09-27 MED ORDER — GLYCOPYRROLATE PF 0.2 MG/ML IJ SOSY
PREFILLED_SYRINGE | INTRAMUSCULAR | Status: AC
  Filled 2023-09-27: qty 1

## 2023-09-27 MED ORDER — DOCUSATE SODIUM 100 MG PO CAPS
100.0000 mg | ORAL_CAPSULE | Freq: Two times a day (BID) | ORAL | Status: DC
  Administered 2023-09-27: 100 mg via ORAL
  Filled 2023-09-27: qty 1

## 2023-09-27 MED ORDER — FENTANYL CITRATE (PF) 100 MCG/2ML IJ SOLN
25.0000 ug | INTRAMUSCULAR | Status: DC | PRN
  Administered 2023-09-27: 50 ug via INTRAVENOUS

## 2023-09-27 MED ORDER — ACETAMINOPHEN 10 MG/ML IV SOLN
1000.0000 mg | INTRAVENOUS | Status: AC
  Administered 2023-09-27: 1000 mg via INTRAVENOUS

## 2023-09-27 MED ORDER — AMPHETAMINE-DEXTROAMPHETAMINE 10 MG PO TABS: 30.0000 mg | ORAL_TABLET | Freq: Every day | ORAL | Status: DC

## 2023-09-27 MED ORDER — MAGNESIUM CITRATE PO SOLN: 1.0000 | Freq: Once | ORAL | Status: DC | PRN

## 2023-09-27 MED ORDER — MIDAZOLAM HCL 2 MG/2ML IJ SOLN
INTRAMUSCULAR | Status: AC
  Filled 2023-09-27: qty 2

## 2023-09-27 MED ORDER — ONDANSETRON HCL 4 MG/2ML IJ SOLN: 4.0000 mg | Freq: Four times a day (QID) | INTRAMUSCULAR | Status: DC | PRN

## 2023-09-27 MED ORDER — AMISULPRIDE (ANTIEMETIC) 5 MG/2ML IV SOLN: 10.0000 mg | Freq: Once | INTRAVENOUS | Status: DC | PRN

## 2023-09-27 MED ORDER — KCL IN DEXTROSE-NACL 20-5-0.45 MEQ/L-%-% IV SOLN: INTRAVENOUS | Status: DC

## 2023-09-27 MED ORDER — ONDANSETRON HCL 4 MG/2ML IJ SOLN
INTRAMUSCULAR | Status: AC
  Filled 2023-09-27: qty 4

## 2023-09-27 MED ORDER — DOCUSATE SODIUM 100 MG PO CAPS
100.0000 mg | ORAL_CAPSULE | Freq: Two times a day (BID) | ORAL | 2 refills | Status: AC
Start: 2023-09-27 — End: 2024-09-26

## 2023-09-27 SURGICAL SUPPLY — 52 items
BAG COUNTER SPONGE SURGICOUNT (BAG) ×1 IMPLANT
BAG DECANTER FOR FLEXI CONT (MISCELLANEOUS) IMPLANT
BAND RUBBER #18 3X1/16 STRL (MISCELLANEOUS) ×2 IMPLANT
BUR EGG ELITE 5.0 (BURR) IMPLANT
BUR RND DIAMOND ELITE 4.0 (BURR) IMPLANT
CLEANER TIP ELECTROSURG 2X2 (MISCELLANEOUS) ×1 IMPLANT
CNTNR URN SCR LID CUP LEK RST (MISCELLANEOUS) ×1 IMPLANT
DRAPE LAPAROTOMY 100X72X124 (DRAPES) ×1 IMPLANT
DRAPE MICROSCOPE SLANT 54X150 (MISCELLANEOUS) ×1 IMPLANT
DRAPE SHEET LG 3/4 BI-LAMINATE (DRAPES) ×1 IMPLANT
DRAPE SURG 17X11 SM STRL (DRAPES) ×1 IMPLANT
DRAPE UTILITY XL STRL (DRAPES) ×1 IMPLANT
DRSG AQUACEL AG ADV 3.5X 4 (GAUZE/BANDAGES/DRESSINGS) IMPLANT
DRSG AQUACEL AG ADV 3.5X 6 (GAUZE/BANDAGES/DRESSINGS) IMPLANT
DRSG TELFA 3X8 NADH STRL (GAUZE/BANDAGES/DRESSINGS) IMPLANT
DURAPREP 26ML APPLICATOR (WOUND CARE) ×1 IMPLANT
DURASEAL SPINE SEALANT 3ML (MISCELLANEOUS) IMPLANT
ELECTRODE BLDE 4.0 EZ CLN MEGD (MISCELLANEOUS) IMPLANT
ELECTRODE REM PT RTRN 9FT ADLT (ELECTROSURGICAL) ×1 IMPLANT
GLOVE BIOGEL PI IND STRL 7.5 (GLOVE) ×1 IMPLANT
GLOVE SURG SS PI 7.0 STRL IVOR (GLOVE) ×1 IMPLANT
GLOVE SURG SS PI 8.0 STRL IVOR (GLOVE) ×2 IMPLANT
GOWN STRL REUS W/ TWL LRG LVL3 (GOWN DISPOSABLE) ×1 IMPLANT
GOWN STRL REUS W/ TWL XL LVL3 (GOWN DISPOSABLE) ×1 IMPLANT
IV CATH 14GX2 1/4 (CATHETERS) ×1 IMPLANT
KIT BASIN OR (CUSTOM PROCEDURE TRAY) ×1 IMPLANT
NDL 22X1.5 STRL (OR ONLY) (MISCELLANEOUS) ×1 IMPLANT
NDL SPNL 18GX3.5 QUINCKE PK (NEEDLE) ×2 IMPLANT
NEEDLE 22X1.5 STRL (OR ONLY) (MISCELLANEOUS) ×1 IMPLANT
NEEDLE SPNL 18GX3.5 QUINCKE PK (NEEDLE) ×2 IMPLANT
NS IRRIG 1000ML POUR BTL (IV SOLUTION) IMPLANT
PACK LAMINECTOMY NEURO (CUSTOM PROCEDURE TRAY) ×1 IMPLANT
PATTIES SURGICAL .25X.25 (GAUZE/BANDAGES/DRESSINGS) IMPLANT
PATTIES SURGICAL .75X.75 (GAUZE/BANDAGES/DRESSINGS) ×1 IMPLANT
SOLUTION PRONTOSAN WOUND 350ML (IRRIGATION / IRRIGATOR) IMPLANT
SPONGE SURGIFOAM ABS GEL 100 (HEMOSTASIS) ×1 IMPLANT
SPONGE T-LAP 4X18 ~~LOC~~+RFID (SPONGE) IMPLANT
STAPLER VISISTAT (STAPLE) IMPLANT
STRIP CLOSURE SKIN 1/2X4 (GAUZE/BANDAGES/DRESSINGS) ×1 IMPLANT
SUT NURALON 4 0 TR CR/8 (SUTURE) IMPLANT
SUT PROLENE 3 0 PS 2 (SUTURE) IMPLANT
SUT VIC AB 1 CT1 27XBRD ANTBC (SUTURE) IMPLANT
SUT VIC AB 1-0 CT2 27 (SUTURE) IMPLANT
SUT VIC AB 2-0 CT1 TAPERPNT 27 (SUTURE) IMPLANT
SUT VIC AB 2-0 CT2 27 (SUTURE) IMPLANT
SYR 3ML LL SCALE MARK (SYRINGE) ×1 IMPLANT
TOWEL GREEN STERILE (TOWEL DISPOSABLE) ×1 IMPLANT
TOWEL GREEN STERILE FF (TOWEL DISPOSABLE) ×1 IMPLANT
TRAY FOLEY MTR SLVR 16FR STAT (SET/KITS/TRAYS/PACK) ×1 IMPLANT
WATER STERILE IRR 1000ML POUR (IV SOLUTION) IMPLANT
WIPE CHG 2% 2PK PREOPERATIVE (MISCELLANEOUS) ×1 IMPLANT
YANKAUER SUCT BULB TIP NO VENT (SUCTIONS) ×1 IMPLANT

## 2023-09-27 NOTE — Anesthesia Postprocedure Evaluation (Signed)
 Anesthesia Post Note  Patient: Katherine Keith  Procedure(s) Performed: Microdiscectomy Lumbar Five-Sacral One Right (Right)     Patient location during evaluation: PACU Anesthesia Type: General Level of consciousness: awake and alert Pain management: pain level controlled Vital Signs Assessment: post-procedure vital signs reviewed and stable Respiratory status: spontaneous breathing, nonlabored ventilation and respiratory function stable Cardiovascular status: blood pressure returned to baseline and stable Postop Assessment: no apparent nausea or vomiting Anesthetic complications: no   No notable events documented.  Last Vitals:  Vitals:   09/27/23 1445 09/27/23 1515  BP: 106/69 119/85  Pulse: 76 66  Resp: 11 20  Temp: (!) 36.3 C 36.7 C  SpO2: 99% 100%    Last Pain:  Vitals:   09/27/23 1622  TempSrc:   PainSc: 7                  Erin Havers

## 2023-09-27 NOTE — Progress Notes (Signed)
 OT Cancellation Note  Patient Details Name: Katherine Keith MRN: [id number] DOB: 1986/01/30   Cancelled Treatment:    Reason Eval/Treat Not Completed: OT screened, no needs identified, will sign off (Pt reports having therapy PTA and is aware of necessary precautions. Discussed with pt her precautions, she denied having any questions or concerns going forward. Per sx team, therapy not required for pt to DC if pt feels adequate/competent.) OT signing off, reconsult if needed.    09/27/2023  AB, OTR/L  Acute Rehabilitation Services  Office: [phone]   [name] 09/27/2023, 5:41 PM

## 2023-09-27 NOTE — Anesthesia Procedure Notes (Signed)
 Procedure Name: Intubation Date/Time: 09/27/2023 12:28 PM  Performed by: Bionca Mckey C, CRNAPre-anesthesia Checklist: Patient identified, Emergency Drugs available, Suction available and Patient being monitored Patient Re-evaluated:Patient Re-evaluated prior to induction Oxygen Delivery Method: Circle system utilized Preoxygenation: Pre-oxygenation with 100% oxygen Induction Type: IV induction Ventilation: Mask ventilation without difficulty Laryngoscope Size: Mac and 3 Grade View: Grade I Tube type: Oral Number of attempts: 1 Airway Equipment and Method: Stylet and Oral airway Placement Confirmation: ETT inserted through vocal cords under direct vision, positive ETCO2 and breath sounds checked- equal and bilateral Secured at: 21 cm Tube secured with: Tape Dental Injury: Teeth and Oropharynx as per pre-operative assessment

## 2023-09-27 NOTE — Discharge Instructions (Signed)

## 2023-09-27 NOTE — Op Note (Unsigned)
 NAMELORAYNE, Keith MEDICAL RECORD NO: [id number] ACCOUNT NO: [id number] DATE OF BIRTH: [birth year] FACILITY: [facility] LOCATION: [facility] PHYSICIAN: Katherine Ring, MD  Operative Report   DATE OF PROCEDURE: 09/27/2023  PREOPERATIVE DIAGNOSIS:  HNP L5-S1, right.  POSTOPERATIVE DIAGNOSIS:  HNP L5-S1, right.  PROCEDURE PERFORMED: 1.  Microdiscectomy L5-S1 right with foraminotomies of S1. 2.  Lysis of epidural venous plexus.  ANESTHESIA:  General.  ASSISTANT:  Katherine Bissell, PA.  HISTORY:  A 38 year old female with right lower extremity radicular pain in the S1 nerve root distribution secondary to focal disc herniation at L5-S1 compression of the S1 nerve root indicated for decompression of the S1 nerve root by microdiscectomy.   Risks and benefits discussed including bleeding, infection, damage to neurovascular structures, no change in symptoms, worsening symptoms, DVT, PE, anesthetic complications, etc.  DESCRIPTION OF PROCEDURE:  The patient was placed in supine position.  After induction of adequate general anesthesia, 2 g of Kefzol , was placed prone on the Katherine Keith frame.  All bony prominences were well padded.  Lumbar region was prepped and draped in  the usual sterile fashion.  An 18-gauge spinal needle was utilized to localize the L5-S1 interspace confirmed with x-ray.  Incision was made from the spinous process of L5 to S1.  Subcutaneous tissue was dissected, electrocautery was utilized to achieve  hemostasis.  Dorsal lumbar fascia divided in line with the skin incision.  The paraspinous muscle was elevated from lamina of L5-S1.  Katherine Keith retractor was placed.  The operating microscope was draped and brought on the surgical field, confirmed level  with a lateral x-ray with a Penfield.  Straight curette was utilized to attach the ligamentum flavum from the cephalad edge of the S1 utilizing that and a Penfield using a Penfield then to enter the epidural space and then use a neural  patty to protect  the neural elements.  I then removed ligamentum flavum from the interspace.  I gently identified the S1 nerve root and mobilized it medially.  I then performed a foraminotomy of S1.  I decompressed the lateral recess to the medial border of the pedicle.   Following this, we found a focal HNP.  It had displaced the S1 nerve root posteriorly and laterally.  There was an epidural venous plexus noted.  It was extensive.  We lysed it and cauterized and divided further mobilizing the S1 nerve root.  Focal HNP  was noted.  There was an extruded fragment that was retrieved fairly large.  It extended to the disc space.  We identified the aperture where it had extruded from.  I performed an annulotomy.  Copious portion of the disc material was removed from the  disc space with the micropituitary and further mobilized with a Katherine Keith and catheter lavage.  Additional fragments were excised.  Following the full decompression, we checked beneath the thecal sac, the shoulder of the root, the axilla, the root, the  foramen of L5 and S1.  Beneath the thecal sac, there was no evidence of a residual disc herniation.  There was 1 cm of excursion in the medial to the pedicle without tension.  Placed thrombin -soaked Gelfoam in the laminotomy defect, obtained a  confirmatory radiograph with a Penfield at L5-S1.  This was then removed, copiously irrigated once again, removed the thrombin -soaked Gelfoam.  No evidence of CSF leakage or active bleeding.  The nerve root was erythematous and edematous, but intact.  I  then removed the [facility] retractor, irrigated the paraspinous musculature.  Bipolar electrocautery  was utilized to achieve hemostasis.  The dorsal lumbar fascia was closed with #1 Vicryl interrupted figure-of-eight sutures, subcutaneous tissue with 2-0  and skin with subcuticular Prolene.  Sterile dressing applied, placed supine in the Keith bed, extubated without difficulty, and transported to the  recovery room in satisfactory condition.  The patient tolerated the procedure well with no complications.  Assistant, Katherine Bissell, PA was used throughout the case for patient positioning, gentle, intermittent neural traction, and closure.  BLOOD LOSS:  25 mL.   PUS D: 09/27/2023 1:47:20 pm T: 09/27/2023 6:10:00 pm  JOB: [id number]/ [id number]

## 2023-09-27 NOTE — Interval H&P Note (Signed)
 History and Physical Interval Note:  09/27/2023 11:52 AM  Katherine Keith  has presented today for surgery, with the diagnosis of Herniated disc L5-S1 right.  The various methods of treatment have been discussed with the patient and family. After consideration of risks, benefits and other options for treatment, the patient has consented to  Procedure(s) with comments: LUMBAR LAMINECTOMY/DECOMPRESSION MICRODISCECTOMY 1 LEVEL (Right) - Microdiscectomy L5-S1 Right as a surgical intervention.  The patient's history has been reviewed, patient examined, no change in status, stable for surgery.  I have reviewed the patient's chart and labs.  Questions were answered to the patient's satisfaction.     Loel Ring

## 2023-09-27 NOTE — Brief Op Note (Signed)
 09/27/2023  11:52 AM  PATIENT:  Katherine Keith  38 y.o. female  PRE-OPERATIVE DIAGNOSIS:  Herniated disc L5-S1 right  POST-OPERATIVE DIAGNOSIS:  * No post-op diagnosis entered *  PROCEDURE:  Procedure(s) with comments: LUMBAR LAMINECTOMY/DECOMPRESSION MICRODISCECTOMY 1 LEVEL (Right) - Microdiscectomy L5-S1 Right  SURGEON:  Surgeons and Role:    Orvan Blanch, MD - Primary  PHYSICIAN ASSISTANT:   ASSISTANTS: Bissell   ANESTHESIA:   general  EBL:  25   BLOOD ADMINISTERED:none  DRAINS: none   LOCAL MEDICATIONS USED:  MARCAINE      SPECIMEN:  No Specimen  DISPOSITION OF SPECIMEN:  N/A  COUNTS:  YES  TOURNIQUET:  * No tourniquets in log *  DICTATION: .Other Dictation: Dictation Number [id number]  PLAN OF CARE: Admit for overnight observation  PATIENT DISPOSITION:  PACU - hemodynamically stable.   Delay start of Pharmacological VTE agent (>24hrs) due to surgical blood loss or risk of bleeding: yes

## 2023-09-27 NOTE — Plan of Care (Signed)

## 2023-09-27 NOTE — Anesthesia Preprocedure Evaluation (Addendum)
 Anesthesia Evaluation  Patient identified by MRN, date of birth, ID band Patient awake    Reviewed: Allergy & Precautions, NPO status , Patient's Chart, lab work & pertinent test results  Airway Mallampati: II  TM Distance: >3 FB Neck ROM: Full    Dental  (+) Teeth Intact, Dental Advisory Given   Pulmonary neg pulmonary ROS   Pulmonary exam normal breath sounds clear to auscultation       Cardiovascular negative cardio ROS Normal cardiovascular exam Rhythm:Regular Rate:Normal     Neuro/Psych Lumbar radiculopathy    GI/Hepatic Neg liver ROS,GERD  Controlled,,  Endo/Other  negative endocrine ROS    Renal/GU negative Renal ROS  negative genitourinary   Musculoskeletal negative musculoskeletal ROS (+)    Abdominal   Peds  (+) ATTENTION DEFICIT DISORDER WITHOUT HYPERACTIVITY and ADHD Hematology negative hematology ROS (+)   Anesthesia Other Findings Day of surgery medications reviewed with the patient.  Reproductive/Obstetrics negative OB ROS                             Anesthesia Physical Anesthesia Plan  ASA: 2  Anesthesia Plan: General   Post-op Pain Management: Ofirmev  IV (intra-op)*   Induction: Intravenous  PONV Risk Score and Plan: 3 and Midazolam , Dexamethasone  and Ondansetron   Airway Management Planned: Oral ETT  Additional Equipment:   Intra-op Plan:   Post-operative Plan: Extubation in OR  Informed Consent: I have reviewed the patients History and Physical, chart, labs and discussed the procedure including the risks, benefits and alternatives for the proposed anesthesia with the patient or authorized representative who has indicated his/her understanding and acceptance.     Dental advisory given  Plan Discussed with: CRNA  Anesthesia Plan Comments:        Anesthesia Quick Evaluation

## 2023-09-27 NOTE — Progress Notes (Signed)
Patient alert and oriented, mae's well, voiding adequate amount of urine, swallowing without difficulty, no c/o pain at time of discharge. Patient discharged home with family. Script and discharged instructions given to patient. Patient and family stated understanding of instructions given. Patient has an appointment with Dr. Beane ?

## 2023-09-27 NOTE — Transfer of Care (Signed)
 Immediate Anesthesia Transfer of Care Note  Patient: Katherine Keith  Procedure(s) Performed: Microdiscectomy Lumbar Five-Sacral One Right (Right)  Patient Location: PACU  Anesthesia Type:General  Level of Consciousness: awake and alert   Airway & Oxygen Therapy: Patient Spontanous Breathing and Patient connected to face mask oxygen  Post-op Assessment: Report given to RN and Post -op Vital signs reviewed and stable  Post vital signs: Reviewed and stable  Last Vitals:  Vitals Value Taken Time  BP 110/78 09/27/23 1405  Temp    Pulse 80 09/27/23 1408  Resp 18 09/27/23 1408  SpO2 100 % 09/27/23 1408  Vitals shown include unfiled device data.  Last Pain:  Vitals:   09/27/23 0845  TempSrc:   PainSc: 5       Patients Stated Pain Goal: 0 (09/27/23 0845)  Complications: No notable events documented.

## 2023-09-27 NOTE — Plan of Care (Signed)
 Problem: Education: Goal: Knowledge of General Education information will improve Description: Including pain rating scale, medication(s)/side effects and non-pharmacologic comfort measures 09/27/2023 1819 by Gregor Learned, RN Outcome: Completed/Met 09/27/2023 1549 by Gregor Learned, RN Outcome: Progressing   Problem: Health Behavior/Discharge Planning: Goal: Ability to manage health-related needs will improve 09/27/2023 1819 by Gregor Learned, RN Outcome: Completed/Met 09/27/2023 1549 by Gregor Learned, RN Outcome: Progressing   Problem: Clinical Measurements: Goal: Ability to maintain clinical measurements within normal limits will improve 09/27/2023 1819 by Gregor Learned, RN Outcome: Completed/Met 09/27/2023 1549 by Gregor Learned, RN Outcome: Progressing Goal: Will remain free from infection 09/27/2023 1819 by Gregor Learned, RN Outcome: Completed/Met 09/27/2023 1549 by Gregor Learned, RN Outcome: Progressing Goal: Diagnostic test results will improve 09/27/2023 1819 by Gregor Learned, RN Outcome: Completed/Met 09/27/2023 1549 by Gregor Learned, RN Outcome: Progressing Goal: Respiratory complications will improve 09/27/2023 1819 by Gregor Learned, RN Outcome: Completed/Met 09/27/2023 1549 by Gregor Learned, RN Outcome: Progressing Goal: Cardiovascular complication will be avoided 09/27/2023 1819 by Gregor Learned, RN Outcome: Completed/Met 09/27/2023 1549 by Gregor Learned, RN Outcome: Progressing   Problem: Activity: Goal: Risk for activity intolerance will decrease 09/27/2023 1819 by Gregor Learned, RN Outcome: Completed/Met 09/27/2023 1549 by Gregor Learned, RN Outcome: Progressing   Problem: Nutrition: Goal: Adequate nutrition will be maintained 09/27/2023 1819 by Gregor Learned, RN Outcome: Completed/Met 09/27/2023 1549 by Gregor Learned, RN Outcome: Progressing   Problem: Coping: Goal: Level of anxiety will decrease 09/27/2023  1819 by Gregor Learned, RN Outcome: Completed/Met 09/27/2023 1549 by Gregor Learned, RN Outcome: Progressing   Problem: Elimination: Goal: Will not experience complications related to bowel motility 09/27/2023 1819 by Gregor Learned, RN Outcome: Completed/Met 09/27/2023 1549 by Gregor Learned, RN Outcome: Progressing Goal: Will not experience complications related to urinary retention 09/27/2023 1819 by Gregor Learned, RN Outcome: Completed/Met 09/27/2023 1549 by Gregor Learned, RN Outcome: Progressing   Problem: Pain Managment: Goal: General experience of comfort will improve and/or be controlled 09/27/2023 1819 by Gregor Learned, RN Outcome: Completed/Met 09/27/2023 1549 by Gregor Learned, RN Outcome: Progressing   Problem: Safety: Goal: Ability to remain free from injury will improve 09/27/2023 1819 by Gregor Learned, RN Outcome: Completed/Met 09/27/2023 1549 by Gregor Learned, RN Outcome: Progressing   Problem: Skin Integrity: Goal: Risk for impaired skin integrity will decrease 09/27/2023 1819 by Gregor Learned, RN Outcome: Completed/Met 09/27/2023 1549 by Gregor Learned, RN Outcome: Progressing   Problem: Education: Goal: Ability to verbalize activity precautions or restrictions will improve 09/27/2023 1819 by Gregor Learned, RN Outcome: Completed/Met 09/27/2023 1549 by Gregor Learned, RN Outcome: Progressing Goal: Knowledge of the prescribed therapeutic regimen will improve 09/27/2023 1819 by Gregor Learned, RN Outcome: Completed/Met 09/27/2023 1549 by Gregor Learned, RN Outcome: Progressing Goal: Understanding of discharge needs will improve 09/27/2023 1819 by Gregor Learned, RN Outcome: Completed/Met 09/27/2023 1549 by Gregor Learned, RN Outcome: Progressing   Problem: Activity: Goal: Ability to avoid complications of mobility impairment will improve 09/27/2023 1819 by Gregor Learned, RN Outcome: Completed/Met 09/27/2023 1549 by  Gregor Learned, RN Outcome: Progressing Goal: Ability to tolerate increased activity will improve 09/27/2023 1819 by Gregor Learned, RN Outcome: Completed/Met 09/27/2023 1549 by Gregor Learned, RN Outcome: Progressing Goal: Will remain free from falls 09/27/2023 1819 by Gregor Learned, RN Outcome: Completed/Met 09/27/2023 1549 by Gregor Learned, RN Outcome: Progressing   Problem: Bowel/Gastric: Goal: Gastrointestinal status for postoperative course will improve 09/27/2023 1819 by Gregor Learned, RN Outcome: Completed/Met 09/27/2023 1549 by Gregor Learned, RN Outcome: Progressing   Problem: Clinical Measurements: Goal: Ability to  maintain clinical measurements within normal limits will improve 09/27/2023 1819 by Gregor Learned, RN Outcome: Completed/Met 09/27/2023 1549 by Gregor Learned, RN Outcome: Progressing Goal: Postoperative complications will be avoided or minimized 09/27/2023 1819 by Gregor Learned, RN Outcome: Completed/Met 09/27/2023 1549 by Gregor Learned, RN Outcome: Progressing Goal: Diagnostic test results will improve 09/27/2023 1819 by Gregor Learned, RN Outcome: Completed/Met 09/27/2023 1549 by Gregor Learned, RN Outcome: Progressing   Problem: Pain Management: Goal: Pain level will decrease 09/27/2023 1819 by Gregor Learned, RN Outcome: Completed/Met 09/27/2023 1549 by Gregor Learned, RN Outcome: Progressing   Problem: Skin Integrity: Goal: Will show signs of wound healing 09/27/2023 1819 by Gregor Learned, RN Outcome: Completed/Met 09/27/2023 1549 by Gregor Learned, RN Outcome: Progressing   Problem: Health Behavior/Discharge Planning: Goal: Identification of resources available to assist in meeting health care needs will improve 09/27/2023 1819 by Gregor Learned, RN Outcome: Completed/Met 09/27/2023 1549 by Gregor Learned, RN Outcome: Progressing   Problem: Bladder/Genitourinary: Goal: Urinary functional status for  postoperative course will improve 09/27/2023 1819 by Gregor Learned, RN Outcome: Completed/Met 09/27/2023 1549 by Gregor Learned, RN Outcome: Progressing

## 2023-09-28 ENCOUNTER — Encounter (HOSPITAL_COMMUNITY): Payer: Self-pay | Admitting: Specialist

## 2023-11-09 ENCOUNTER — Other Ambulatory Visit: Payer: Self-pay

## 2023-11-09 ENCOUNTER — Telehealth: Payer: Self-pay | Admitting: Physician Assistant

## 2023-11-09 MED ORDER — AMPHETAMINE-DEXTROAMPHETAMINE 30 MG PO TABS: 30.0000 mg | ORAL_TABLET | Freq: Every day | ORAL | 0 refills | Status: DC

## 2023-11-09 MED ORDER — AMPHETAMINE-DEXTROAMPHETAMINE 15 MG PO TABS: 15.0000 mg | ORAL_TABLET | Freq: Every day | ORAL | 0 refills | Status: DC

## 2023-11-09 NOTE — Telephone Encounter (Signed)
 Pended 3 RF on both doses of Adderall to [facility] on [address]

## 2023-11-09 NOTE — Telephone Encounter (Signed)
 Next visit is 01/29/24.Requesting refills on Adderall 15 mg and 30 mg called to:  [id number] - [place], [place] - [address] AT [address] [facility] [address]   Phone: [phone]  Fax: [phone]    She said they are in stock.

## 2024-01-29 ENCOUNTER — Telehealth: Admitting: Physician Assistant

## 2024-01-29 ENCOUNTER — Encounter: Payer: Self-pay | Admitting: Physician Assistant

## 2024-01-29 DIAGNOSIS — F411 Generalized anxiety disorder: Secondary | ICD-10-CM

## 2024-01-29 DIAGNOSIS — F909 Attention-deficit hyperactivity disorder, unspecified type: Secondary | ICD-10-CM

## 2024-01-29 MED ORDER — AMPHETAMINE-DEXTROAMPHETAMINE 30 MG PO TABS: 30.0000 mg | ORAL_TABLET | Freq: Every day | ORAL | 0 refills | Status: AC

## 2024-01-29 MED ORDER — AMPHETAMINE-DEXTROAMPHETAMINE 15 MG PO TABS: 15.0000 mg | ORAL_TABLET | Freq: Every day | ORAL | 0 refills | Status: AC

## 2024-01-29 NOTE — Progress Notes (Signed)
 [facility]  Patient ID: Katherine Keith,  MRN: [id number]  PCP: Katherine Therisa MATSU, PA  Date of Evaluation: 01/29/2024 Time spent:20 minutes  Chief Complaint:  Chief Complaint   ADHD; Follow-up   Virtual Visit via Telehealth  I connected with patient by a video enabled telemedicine application  with their informed consent, and verified patient privacy and that I am speaking with the correct person using two identifiers.  I am private, in my office and the patient is at work.  I discussed the limitations, risks, security and privacy concerns of performing an evaluation and management service by video and the availability of in person appointments. I also discussed with the patient that there may be a patient responsible charge related to this service. The patient expressed understanding and agreed to proceed.   I discussed the assessment and treatment plan with the patient. The patient was provided an opportunity to ask questions and all were answered. The patient agreed with the plan and demonstrated an understanding of the instructions.   The patient was advised to call back or seek an in-person evaluation if the symptoms worsen or if the condition fails to improve as anticipated.  I provided approximately 20  minutes of non-face-to-face time during this encounter.  HISTORY/CURRENT STATUS:  HPI For routine med check.   As far as the Adderall goes, [name] is doing well. States that attention is good without easy distractibility.  Able to focus on things and finish tasks to completion.   Patient is able to enjoy things.  Energy and motivation are good.   No extreme sadness, tearfulness, or feelings of hopelessness.  She is on light duty at work.  She had herniated disk surgery in April.  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  She will probably go back to full duty soon.  She needs to use equipment  that is lighter weight, but so far the department has not approved it.  ADLs and personal hygiene are normal.   Appetite has not changed.  Weight is stable.  No mania, delirium, AH/VH.  No SI/HI.  Individual Medical History/ Review of Systems: Changes? :Yes   L 5 herniated disc surgery in April of this year.    Past medications for mental health diagnoses include: Unknown  Allergies: Patient has no known allergies.  Current Medications:  Current Outpatient Medications:    amphetamine -dextroamphetamine  (ADDERALL) 15 MG tablet, Take 1 tablet by mouth daily at 12 noon., Disp: 30 tablet, Rfl: 0   amphetamine -dextroamphetamine  (ADDERALL) 15 MG tablet, Take 1 tablet by mouth daily at 12 noon., Disp: 30 tablet, Rfl: 0   amphetamine -dextroamphetamine  (ADDERALL) 15 MG tablet, Take 1 tablet by mouth daily at 12 noon., Disp: 30 tablet, Rfl: 0   amphetamine -dextroamphetamine  (ADDERALL) 30 MG tablet, Take 1 tablet by mouth daily., Disp: 30 tablet, Rfl: 0   amphetamine -dextroamphetamine  (ADDERALL) 30 MG tablet, Take 1 tablet by mouth daily., Disp: 30 tablet, Rfl: 0   amphetamine -dextroamphetamine  (ADDERALL) 30 MG tablet, Take 1 tablet by mouth daily., Disp: 30 tablet, Rfl: 0   levonorgestrel (MIRENA) 20 MCG/24HR IUD, 1 each by Intrauterine route once., Disp: , Rfl:    methocarbamol  (ROBAXIN ) 500 MG tablet, Take 500 mg by mouth at bedtime as needed for muscle spasms., Disp: , Rfl:    polyethylene glycol (MIRALAX  / GLYCOLAX ) 17 g packet, Take 17 g by mouth daily., Disp: 14 each, Rfl:  0   docusate sodium  (COLACE) 100 MG capsule, Take 1 capsule (100 mg total) by mouth 2 (two) times daily. (Patient not taking: Reported on 01/29/2024), Disp: 60 capsule, Rfl: 2   methocarbamol  (ROBAXIN ) 500 MG tablet, Take 1 tablet (500 mg total) by mouth every 8 (eight) hours as needed for muscle spasms. (Patient not taking: Reported on 01/29/2024), Disp: 30 tablet, Rfl: 1   Oxycodone  HCl 10 MG TABS, Take 10 mg by mouth every 8  (eight) hours as needed (Pain). (Patient not taking: Reported on 01/29/2024), Disp: , Rfl:  Medication Side Effects: none  Family Medical/ Social History: Changes?  No  MENTAL HEALTH EXAM:  There were no vitals taken for this visit.There is no height or weight on file to calculate BMI.  General: casual, well groomed  Eye Contact:  Good  Speech:  Clear and Coherent and Normal Rate  Volume:  Normal  Mood:  Euthymic  Affect:  Congruent  Thought Process:  Goal Directed and Descriptions of Associations: Circumstantial  Orientation:  Full (Time, Place, and Person)  Thought Content: Logical   Suicidal Thoughts:  No  Homicidal Thoughts:  No  Memory:  WNL  Judgement:  Good  Insight:  Good  Psychomotor Activity:  Normal  Concentration:  Concentration: Good and Attention Span: Good  Recall:  Good  Fund of Knowledge: Good  Language: Good  Assets:  Communication Skills Desire for Improvement Financial Resources/Insurance Housing Transportation Vocational/Educational  ADL's:  Intact  Cognition: WNL  Prognosis:  Good   DIAGNOSES:    ICD-10-CM   1. Attention deficit hyperactivity disorder (ADHD), unspecified ADHD type  F90.9     2. Generalized anxiety disorder  F41.1       Receiving Psychotherapy: No   RECOMMENDATIONS:  PDMP was reviewed.  Last Adderall 12/16/2023. I provided approximately 20 minutes of non-face to face time during this encounter, including time spent before and after the visit in records review, medical decision making, counseling pertinent to today's visit, and charting.   She is doing well on the current dose of Adderall so no changes need to be made.  Continue Adderall 30 mg every morning. Continue Adderall 15 mg daily around noon. Return in 6 months.  Verneita Cooks, PA-C
